# Patient Record
Sex: Female | Born: 1980 | Race: White | Hispanic: No | Marital: Married | Smoking: Never smoker
Health system: Southern US, Community
[De-identification: ages and names within clinical notes are randomized; demographics above are authoritative.]

## PROBLEM LIST (undated history)

## (undated) DIAGNOSIS — F419 Anxiety disorder, unspecified: Secondary | ICD-10-CM

## (undated) HISTORY — PX: APPENDECTOMY: SHX54

---

## 2019-05-19 ENCOUNTER — Ambulatory Visit: Payer: Self-pay | Admitting: Physician Assistant

## 2019-05-26 ENCOUNTER — Encounter: Payer: Self-pay | Admitting: Physician Assistant

## 2019-05-26 ENCOUNTER — Other Ambulatory Visit: Payer: Self-pay

## 2019-05-26 ENCOUNTER — Ambulatory Visit (INDEPENDENT_AMBULATORY_CARE_PROVIDER_SITE_OTHER): Payer: Self-pay | Admitting: Physician Assistant

## 2019-05-26 VITALS — BP 134/87 | HR 90 | Temp 99.6°F | Ht 65.0 in | Wt 135.0 lb

## 2019-05-26 DIAGNOSIS — F411 Generalized anxiety disorder: Secondary | ICD-10-CM

## 2019-05-26 DIAGNOSIS — Z91011 Allergy to milk products: Secondary | ICD-10-CM

## 2019-05-26 DIAGNOSIS — F320 Major depressive disorder, single episode, mild: Secondary | ICD-10-CM

## 2019-05-26 MED ORDER — ESCITALOPRAM OXALATE 10 MG PO TABS: 10.0000 mg | ORAL_TABLET | Freq: Every day | ORAL | 1 refills | Status: DC

## 2019-05-26 NOTE — Patient Instructions (Signed)

## 2019-06-03 DIAGNOSIS — F411 Generalized anxiety disorder: Secondary | ICD-10-CM | POA: Insufficient documentation

## 2019-06-03 DIAGNOSIS — F320 Major depressive disorder, single episode, mild: Secondary | ICD-10-CM | POA: Insufficient documentation

## 2019-06-03 DIAGNOSIS — Z91011 Allergy to milk products: Secondary | ICD-10-CM | POA: Insufficient documentation

## 2019-06-03 NOTE — Progress Notes (Signed)
BP 134/87   Pulse 90   Temp 99.6 F (37.6 C)   Ht 5\' 5"  (1.651 m)   Wt 135 lb (61.2 kg)   LMP 05/19/2019   SpO2 100%   BMI 22.47 kg/m    Subjective:    Patient ID: Brenda [name], female    DOB: 1980-04-18, 39 y.o.   MRN: [id number]  HPI 1. Depression, major, single episode, mild ([place])  2. GAD (generalized anxiety disorder)  3. Milk allergy   HPI: Brenda [name] is a 39 y.o. female presenting on 05/26/2019 for Establish Care  She comes in to be established and reports that she has anxiety and depression. She did have treatment in the past and thinks she needs to get started again. Her screens are positive  GAD 7 : Generalized Anxiety Score 05/26/2019  Nervous, Anxious, on Edge 2  Control/stop worrying 2  Worry too much - different things 2  Trouble relaxing 2  Restless 1  Easily annoyed or irritable 1  Afraid - awful might happen 2  Total GAD 7 Score 12  Anxiety Difficulty Very difficult    Depression screen PHQ 2/9 05/26/2019  Decreased Interest 1  Down, Depressed, Hopeless 1  PHQ - 2 Score 2  Altered sleeping 2  Tired, decreased energy 2  Change in appetite 1  Feeling bad or failure about yourself  1  Trouble concentrating 1  Moving slowly or fidgety/restless 2  Suicidal thoughts 0  PHQ-9 Score 11  Difficult doing work/chores Very difficult     History reviewed. No pertinent past medical history. Relevant past medical, surgical, family and social history reviewed and updated as indicated. Interim medical history since our last visit reviewed. Allergies and medications reviewed and updated. DATA REVIEWED: CHART IN EPIC  Family History reviewed for pertinent findings.  Review of Systems  Constitutional: Negative.   HENT: Negative.   Eyes: Negative.   Respiratory: Negative.   Gastrointestinal: Negative.   Genitourinary: Negative.   Psychiatric/Behavioral: Positive for decreased concentration and dysphoric mood. The patient is nervous/anxious.      Allergies as of 05/26/2019   Not on File     Medication List       Accurate as of May 26, 2019 11:59 PM. If you have any questions, ask your nurse or doctor.        escitalopram 10 MG tablet Commonly known as: Lexapro Take 1 tablet (10 mg total) by mouth daily. Started by: Terald Sleeper, PA-C          Objective:    BP 134/87   Pulse 90   Temp 99.6 F (37.6 C)   Ht 5\' 5"  (1.651 m)   Wt 135 lb (61.2 kg)   LMP 05/19/2019   SpO2 100%   BMI 22.47 kg/m   Not on File  Wt Readings from Last 3 Encounters:  05/26/19 135 lb (61.2 kg)    Physical Exam Constitutional:      General: She is not in acute distress.    Appearance: Normal appearance. She is well-developed.  HENT:     Head: Normocephalic and atraumatic.  Cardiovascular:     Rate and Rhythm: Normal rate.  Pulmonary:     Effort: Pulmonary effort is normal.  Skin:    General: Skin is warm and dry.     Findings: No rash.  Neurological:     Mental Status: She is alert and oriented to person, place, and time.     Deep Tendon Reflexes: Reflexes are  normal and symmetric.     No results found for this or any previous visit.    Assessment & Plan:   1. Depression, major, single episode, mild ([facility]CC) - escitalopram (LEXAPRO) 10 MG tablet; Take 1 tablet (10 mg total) by mouth daily.  Dispense: 30 tablet; Refill: 1  2. GAD (generalized anxiety disorder) - escitalopram (LEXAPRO) 10 MG tablet; Take 1 tablet (10 mg total) by mouth daily.  Dispense: 30 tablet; Refill: 1  3. Milk allergy monitor   Continue all other maintenance medications as listed above.  Follow up plan: Return in about 4 weeks (around 06/23/2019).  Educational handout given for anxiety  Terald Sleeper PA-C [facility] [address]  [place], [place] [address] [phone]   06/03/2019, 1:53 PM

## 2019-07-02 ENCOUNTER — Ambulatory Visit: Payer: Self-pay | Admitting: Physician Assistant

## 2019-07-02 ENCOUNTER — Ambulatory Visit: Payer: 59 | Admitting: Family Medicine

## 2019-07-02 ENCOUNTER — Other Ambulatory Visit: Payer: Self-pay

## 2019-07-02 ENCOUNTER — Encounter: Payer: Self-pay | Admitting: Family Medicine

## 2019-07-02 VITALS — BP 128/92 | HR 76 | Temp 98.3°F | Ht 65.0 in | Wt 135.2 lb

## 2019-07-02 DIAGNOSIS — Z13220 Encounter for screening for lipoid disorders: Secondary | ICD-10-CM

## 2019-07-02 DIAGNOSIS — Z130 Encounter for screening for diseases of the blood and blood-forming organs and certain disorders involving the immune mechanism: Secondary | ICD-10-CM

## 2019-07-02 DIAGNOSIS — Z7689 Persons encountering health services in other specified circumstances: Secondary | ICD-10-CM | POA: Diagnosis not present

## 2019-07-02 DIAGNOSIS — F411 Generalized anxiety disorder: Secondary | ICD-10-CM | POA: Diagnosis not present

## 2019-07-02 DIAGNOSIS — R030 Elevated blood-pressure reading, without diagnosis of hypertension: Secondary | ICD-10-CM

## 2019-07-02 DIAGNOSIS — F320 Major depressive disorder, single episode, mild: Secondary | ICD-10-CM | POA: Diagnosis not present

## 2019-07-02 DIAGNOSIS — E049 Nontoxic goiter, unspecified: Secondary | ICD-10-CM

## 2019-07-02 NOTE — Progress Notes (Signed)
Subjective: CC: f/u GAD, est care PCP: Janora Norlander, DO Brenda [name] is a 39 y.o. female presenting to clinic today for:  1.  Generalized anxiety disorder/depressive disorder Patient was seen 1 month ago by previous PCP for anxiety and depressive symptoms.  She had been on Zoloft about 18 years ago but this caused a funny feeling in her head.  She had discontinued on her own and has not been on any medication since.  She was started on Lexapro last visit and she does note some improvement in her jitteriness.  She still worries about some things but overall she is trying to force herself to be a little bit more interactive and go outside and do things.  She has been keeping a diary of her night sweats and sleeping patterns.  She notes that she has night sweats almost every evening.  Additionally, she reports alternating constipation diarrhea.  No unplanned weight loss.  No change in voice, difficulty swallowing.  No history of radiation or surgery to the neck.  She does admit to having a side of the neck that occasionally becomes swollen.  She always thought this was related to allergies and sometimes will take Benadryl for this.  After further discussion, it appears that she has a family history in her mother of thyroid disorder.  Denies any tobacco use but does smoke marijuana intermittently.  ROS: Per HPI  No Known Allergies History reviewed. No pertinent past medical history.  Current Outpatient Medications:  .  escitalopram (LEXAPRO) 10 MG tablet, Take 1 tablet (10 mg total) by mouth daily., Disp: 30 tablet, Rfl: 1 Social History   Socioeconomic History  . Marital status: Married    Spouse name: [place]  . Number of children: 2  . Years of education: some college  . 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 Smoking status: Never Smoker  . Smokeless tobacco: Never Used  Substance and Sexual Activity  .  Alcohol use: Never  . Drug use: Never  . Sexual activity: Yes    Comment: tubal  Other Topics Concern  . Not on file  Social History Narrative  . Not on file   Social Determinants of Health   Financial Resource Strain:   . Difficulty of Paying Living Expenses:   Food Insecurity:   . Worried About Charity fundraiser in the Last Year:   . Arboriculturist in the Last Year:   Transportation Needs:   . Film/video editor (Medical):   Marland Kitchen Lack of Transportation (Non-Medical):   Physical Activity:   . Days of Exercise per Week:   . Minutes of Exercise per Session:   Stress:   . Feeling of Stress :   Social Connections:   . Frequency of Communication with Friends and Family:   . Frequency of Social Gatherings with Friends and Family:   . Attends Religious Services:   . Active Member of Clubs or Organizations:   . Attends Archivist Meetings:   Marland Kitchen Marital Status:   Intimate Partner Violence:   . Fear of Current or Ex-Partner:   . Emotionally Abused:   Marland Kitchen Physically Abused:   . Sexually Abused:    Family History  Problem Relation Age of Onset  . Anxiety disorder Mother   . Asthma Mother   . COPD Mother   . Hypertension Mother   . Hyperlipidemia Mother   .  Osteoporosis Mother   . Heart disease Father   . Bipolar disorder Sister   . Schizophrenia Sister   . Drug abuse Sister   . Hyperlipidemia Maternal Grandmother   . Hypertension Maternal Grandmother   . Stroke Maternal Grandmother   . Alzheimer's disease Maternal Grandmother   . Cancer Maternal Grandfather   . Heart disease Maternal Grandfather   . Kidney disease Paternal Grandmother   . Cancer Paternal Grandfather   . Drug abuse Brother   . Anxiety disorder Brother   . Depression Brother   . Thyroid disease Brother   . Asthma Son     Objective: Office vital signs reviewed. BP (!) 128/92   Pulse 76   Temp 98.3 F (36.8 C)   Ht '5\' 5"'$  (1.651 m)   Wt 135 lb 3.2 oz (61.3 kg)   SpO2 99%   BMI 22.50  kg/m   Physical Examination:  General: Awake, alert, well nourished, No acute distress HEENT: Normal; no exophthalmos.  There is a visible soft tissue swelling noted at the base of the neck.  Palpation suggest that this is a goiter in the left lobe of the thyroid. Cardio: regular rate and rhythm, S1S2 heard, no murmurs appreciated Pulm: clear to auscultation bilaterally, no wheezes, rhonchi or rales; normal work of breathing on room air Extremities: warm, well perfused, No edema, cyanosis or clubbing; +2 pulses bilaterally MSK: normal gait and station Skin: dry; intact; no rashes or lesions; normal temperature Neuro: No tremor Psych: Mood stable, speech normal, affect appropriate, pleasant and interactive.  Depression screen [facility] 2/9 07/02/2019 05/26/2019  Decreased Interest 1 1  Down, Depressed, Hopeless 1 1  PHQ - 2 Score 2 2  Altered sleeping 2 2  Tired, decreased energy 1 2  Change in appetite 1 1  Feeling bad or failure about yourself  1 1  Trouble concentrating 2 1  Moving slowly or fidgety/restless 1 2  Suicidal thoughts 0 0  PHQ-9 Score 10 11  Difficult doing work/chores Somewhat difficult Very difficult   GAD 7 : Generalized Anxiety Score 07/02/2019 05/26/2019  Nervous, Anxious, on Edge 2 2  Control/stop worrying 2 2  Worry too much - different things 2 2  Trouble relaxing 2 2  Restless 1 1  Easily annoyed or irritable 1 1  Afraid - awful might happen 2 2  Total GAD 7 Score 12 12  Anxiety Difficulty Somewhat difficult Very difficult   Assessment/ Plan: 39 y.o. female   1. GAD (generalized anxiety disorder) Slightly improving.  I am more concerned that she may have an underlying thyroid disorder causing her anxiety and depressive symptoms.  See below - CMP14+EGFR  2. Depression, major, single episode, mild (HCC) Continue Lexapro for now until thyroid work-up complete  3. Establishing care with new doctor, encounter for  4. Screening, anemia, deficiency, iron -  CBC  5. Screening cholesterol level Fasting cholesterol check - Lipid Panel  6. Goiter Physical exam suggest goiter of the left thyroid lobe.  We will proceed with ultrasound, thyroid labs.  Will contact patient once results are available. - Thyroid Panel With TSH - Thyroid Peroxidase Antibody - Thyroglobulin antibody - Thyrotropin receptor autoabs - US THYROID; Future  7. Elevated blood pressure reading without diagnosis of hypertension Possibly due to the above.   Orders Placed This Encounter  Procedures  . TSH  . CMP14+EGFR  . Lipid Panel  . CBC   No orders of the defined types were placed in this encounter.  Janora Norlander, DO [facility] [phone]

## 2019-07-02 NOTE — Patient Instructions (Signed)
You had labs performed today.  You will be contacted with the results of the labs once they are available, usually in the next 3 business days for routine lab work.  If you have an active my chart account, they will be released to your MyChart.  If you prefer to have these labs released to you via telephone, please let us know.  If you had a pap smear or biopsy performed, expect to be contacted in about 7-10 days.   Goiter  A goiter is an enlarged thyroid gland. The thyroid is located in the lower front of the neck. It makes hormones that affect many body parts and systems, including the system that affects how quickly the body burns fuel for energy (metabolism). Most goiters are painless and are not a cause for concern. Some goiters can affect the way your thyroid makes thyroid hormones. Goiters and conditions that cause goiters can be treated, if necessary. What are the causes? Common causes of this condition include:  Lack (deficiency) of a mineral called iodine. The thyroid gland uses iodine to make thyroid hormones.  Diseases that attack healthy cells in the body (autoimmune diseases) and affect thyroid function, such as Graves' disease or Hashimoto's disease. These diseases may cause the body to produce too much thyroid hormone (hyperthyroidism) or too little of the hormone (hypothyroidism).  Conditions that cause inflammation of the thyroid (thyroiditis).  One or more small growths on the thyroid (nodular goiter). Other causes include:  Medical problems caused by abnormal genes that are passed from parent to child (genetic defects).  Thyroid injury or infection.  Tumors that may or may not be cancerous.  Pregnancy.  Certain medicines.  Exposure to radiation. In some cases, the cause may not be known. What increases the risk? This condition is more likely to develop in:  People who do not get enough iodine in their diet.  People who have a family history of  goiter.  Women.  People who are older than age 68.  People who smoke tobacco.  People who have had exposure to radiation. What are the signs or symptoms? The main symptom of this condition is swelling in the lower, front part of the neck. This swelling can range from a very small bump to a large lump. Other symptoms may include:  A tight feeling in the throat.  A hoarse voice.  Coughing.  Wheezing.  Difficulty swallowing or breathing.  Bulging veins in the neck.  Dizziness. When a goiter is the result of an overactive thyroid (hyperthyroidism), symptoms may also include:  Nervousness or restlessness.  Inability to tolerate heat.  Unexplained weight loss.  Diarrhea.  Change in the texture of hair or skin.  Changes in heartbeat, such as skipped beats, extra beats, or a rapid heart rate.  Loss of menstruation.  Shaky hands.  Increased appetite.  Sleep problems. When a goiter is the result of an underactive thyroid (hypothyroidism), symptoms may also include:  Feeling like you have no energy (lethargy).  Inability to tolerate cold.  Weight gain that is not explained by a change in diet or exercise habits.  Dry skin.  Coarse hair.  Irregular menstrual periods.  Constipation.  Sadness or depression.  Fatigue. In some cases, there may not be any symptoms and the thyroid hormone levels may be normal. How is this diagnosed? This condition may be diagnosed based on your symptoms, your medical history, and a physical exam. You may have tests, such as:  Blood tests to check  thyroid function.  Imaging tests, such as: ? Ultrasound. ? CT scan. ? MRI. ? Thyroid scan.  Removal of a tissue sample (biopsy) of the goiter or any nodules. The sample will be tested to check for cancer. How is this treated? Treatment for this condition depends on the cause and your symptoms. Treatment may include:  Medicines to regulate thyroid hormone  levels.  Anti-inflammatory medicines or steroid medicines, if the goiter is caused by inflammation.  Iodine supplements or changes to your diet, if the goiter is caused by iodine deficiency.  Radioactive iodine treatment.  Surgery to remove your thyroid. In some cases, you may only need regular check-ups with your health care provider to monitor your condition, and you may not need treatment. Follow these instructions at home:  Follow instructions from your health care provider about any changes to your diet.  Take over-the-counter and prescription medicines only as told by your health care provider. These include supplements.  Do not use any products that contain nicotine or tobacco, such as cigarettes and e-cigarettes. If you need help quitting, ask your health care provider.  Keep all follow-up visits as told by your health care provider. This is important. Contact a health care provider if:  Your symptoms do not get better with treatment.  You have nausea, vomiting, or diarrhea. Get help right away if:  You have sudden, unexplained confusion or other mental changes.  You have a fever.  You have chest pain.  You have trouble breathing or swallowing.  You suddenly become very weak.  You experience extreme restlessness.  You feel your heart racing. Summary  A goiter is an enlarged thyroid gland.  The thyroid gland is located in the lower front of the neck. It makes hormones that affect many body parts and systems, including the system that affects how quickly the body burns fuel for energy (metabolism).  The main symptom of this condition is swelling in the lower, front part of the neck. This swelling can range from a very small bump to a large lump.  Treatment for this condition depends on the cause and your symptoms. You may need medicines, supplements, or regular monitoring of your condition. This information is not intended to replace advice given to you by your  health care provider. Make sure you discuss any questions you have with your health care provider. Document Revised: 02/07/2017 Document Reviewed: 11/21/2016 Elsevier Patient Education  2020 [employer].

## 2019-07-05 LAB — CMP14+EGFR
ALT: 12 IU/L (ref 0–32)
AST: 20 IU/L (ref 0–40)
Albumin/Globulin Ratio: 1.6 (ref 1.2–2.2)
Albumin: 4.5 g/dL (ref 3.8–4.8)
Alkaline Phosphatase: 61 IU/L (ref 39–117)
BUN/Creatinine Ratio: 11 (ref 9–23)
BUN: 8 mg/dL (ref 6–20)
Bilirubin Total: 0.2 mg/dL (ref 0.0–1.2)
CO2: 23 mmol/L (ref 20–29)
Calcium: 9.7 mg/dL (ref 8.7–10.2)
Chloride: 100 mmol/L (ref 96–106)
Creatinine, Ser: 0.73 mg/dL (ref 0.57–1.00)
GFR calc Af Amer: 122 mL/min/{1.73_m2} (ref >59)
GFR calc non Af Amer: 106 mL/min/{1.73_m2} (ref >59)
Globulin, Total: 2.8 g/dL (ref 1.5–4.5)
Glucose: 85 mg/dL (ref 65–99)
Potassium: 4.5 mmol/L (ref 3.5–5.2)
Sodium: 143 mmol/L (ref 134–144)
Total Protein: 7.3 g/dL (ref 6.0–8.5)

## 2019-07-05 LAB — THYROID PANEL WITH TSH
Free Thyroxine Index: 1.7 (ref 1.2–4.9)
T3 Uptake Ratio: 25 % (ref 24–39)
T4, Total: 6.6 ug/dL (ref 4.5–12.0)
TSH: 1.96 u[IU]/mL (ref 0.450–4.500)

## 2019-07-05 LAB — THYROID PEROXIDASE ANTIBODY: Thyroperoxidase Ab SerPl-aCnc: <9 IU/mL (ref 0–34)

## 2019-07-05 LAB — CBC
Hematocrit: 42.9 % (ref 34.0–46.6)
Hemoglobin: 14.2 g/dL (ref 11.1–15.9)
MCH: 31.6 pg (ref 26.6–33.0)
MCHC: 33.1 g/dL (ref 31.5–35.7)
MCV: 96 fL (ref 79–97)
Platelets: 282 10*3/uL (ref 150–450)
RBC: 4.49 x10E6/uL (ref 3.77–5.28)
RDW: 11.8 % (ref 11.7–15.4)
WBC: 7.6 10*3/uL (ref 3.4–10.8)

## 2019-07-05 LAB — LIPID PANEL
Chol/HDL Ratio: 3.7 ratio (ref 0.0–4.4)
Cholesterol, Total: 157 mg/dL (ref 100–199)
HDL: 42 mg/dL (ref >39)
LDL Chol Calc (NIH): 101 mg/dL — ABNORMAL HIGH (ref 0–99)
Triglycerides: 73 mg/dL (ref 0–149)
VLDL Cholesterol Cal: 14 mg/dL (ref 5–40)

## 2019-07-05 LAB — THYROTROPIN RECEPTOR AUTOABS: Thyrotropin Receptor Ab: <1.1 IU/L (ref 0.00–1.75)

## 2019-07-05 LAB — THYROGLOBULIN ANTIBODY: Thyroglobulin Antibody: <1 IU/mL (ref 0.0–0.9)

## 2019-07-07 ENCOUNTER — Other Ambulatory Visit: Payer: Self-pay | Admitting: *Deleted

## 2019-07-07 DIAGNOSIS — F411 Generalized anxiety disorder: Secondary | ICD-10-CM

## 2019-07-07 DIAGNOSIS — F320 Major depressive disorder, single episode, mild: Secondary | ICD-10-CM

## 2019-07-07 MED ORDER — ESCITALOPRAM OXALATE 10 MG PO TABS: 10.0000 mg | ORAL_TABLET | Freq: Every day | ORAL | 1 refills | Status: DC

## 2019-07-09 ENCOUNTER — Ambulatory Visit (HOSPITAL_COMMUNITY)
Admission: RE | Admit: 2019-07-09 | Discharge: 2019-07-09 | Disposition: A | Discharge status: Home / Self Care | Payer: 59 | Source: Ambulatory Visit | Attending: Family Medicine | Admitting: Family Medicine

## 2019-07-09 ENCOUNTER — Other Ambulatory Visit: Payer: Self-pay

## 2019-07-09 DIAGNOSIS — E049 Nontoxic goiter, unspecified: Secondary | ICD-10-CM | POA: Insufficient documentation

## 2019-07-13 ENCOUNTER — Other Ambulatory Visit: Payer: Self-pay | Admitting: Family Medicine

## 2019-07-13 DIAGNOSIS — E041 Nontoxic single thyroid nodule: Secondary | ICD-10-CM

## 2019-07-28 ENCOUNTER — Ambulatory Visit (HOSPITAL_COMMUNITY)
Admission: RE | Admit: 2019-07-28 | Discharge: 2019-07-28 | Disposition: A | Discharge status: Home / Self Care | Payer: 59 | Source: Ambulatory Visit | Attending: Family Medicine | Admitting: Family Medicine

## 2019-07-28 ENCOUNTER — Other Ambulatory Visit: Payer: Self-pay

## 2019-07-28 ENCOUNTER — Encounter (HOSPITAL_COMMUNITY): Payer: Self-pay

## 2019-07-28 DIAGNOSIS — E041 Nontoxic single thyroid nodule: Secondary | ICD-10-CM | POA: Diagnosis not present

## 2019-07-28 MED ORDER — LIDOCAINE HCL (PF) 2 % IJ SOLN
INTRAMUSCULAR | Status: AC
  Filled 2019-07-28: qty 10

## 2019-07-28 NOTE — Procedures (Signed)
PreOperative Dx: LEFT thyroid nodule Postoperative Dx: LEFT thyroid nodule Procedure:   US guided FNA of LEFT thyroid nodule Radiologist:  Thornton Papas Anesthesia:  2 ml of 2% lidocaine Specimen:  FNA x 5 (3 cytology, 2 Afirma) EBL:   < 1 ml Complications: None

## 2019-07-29 LAB — CYTOLOGY - NON PAP

## 2019-07-30 ENCOUNTER — Telehealth: Payer: Self-pay | Admitting: Family Medicine

## 2019-07-30 NOTE — Telephone Encounter (Signed)
Spoke to patient.  We reviewed the results which showed atypical cells of undetermined significance.  This has been sent for Afirma testing and plan pending her risk.  I have reached out to Dr. Benjamine Mola about her case as well.  For now, we will wait for the test results and then plan referral pending these results.

## 2019-07-30 NOTE — Telephone Encounter (Signed)
Provider to review results.

## 2019-08-12 ENCOUNTER — Encounter: Payer: Self-pay | Admitting: Family Medicine

## 2019-08-12 ENCOUNTER — Encounter (HOSPITAL_COMMUNITY): Payer: Self-pay | Admitting: Diagnostic Radiology

## 2019-08-13 ENCOUNTER — Other Ambulatory Visit: Payer: Self-pay | Admitting: Family Medicine

## 2019-08-13 DIAGNOSIS — R897 Abnormal histological findings in specimens from other organs, systems and tissues: Secondary | ICD-10-CM

## 2019-08-13 DIAGNOSIS — E041 Nontoxic single thyroid nodule: Secondary | ICD-10-CM

## 2019-08-13 NOTE — Telephone Encounter (Signed)
Spoke to pt on phone. Biopsy was indeterminate.  I have placed referral to Dr Benjamine Mola, as I have been curbside consulting with him for her case.

## 2019-08-30 ENCOUNTER — Encounter: Payer: Self-pay | Admitting: Family Medicine

## 2019-08-31 ENCOUNTER — Other Ambulatory Visit: Payer: Self-pay | Admitting: Otolaryngology

## 2019-09-03 ENCOUNTER — Other Ambulatory Visit: Payer: Self-pay

## 2019-09-03 ENCOUNTER — Encounter (HOSPITAL_BASED_OUTPATIENT_CLINIC_OR_DEPARTMENT_OTHER): Payer: Self-pay | Admitting: Otolaryngology

## 2019-09-10 ENCOUNTER — Other Ambulatory Visit (HOSPITAL_COMMUNITY)
Admission: RE | Admit: 2019-09-10 | Discharge: 2019-09-10 | Disposition: A | Discharge status: Home / Self Care | Payer: 59 | Source: Ambulatory Visit | Attending: Otolaryngology | Admitting: Otolaryngology

## 2019-09-10 DIAGNOSIS — Z20822 Contact with and (suspected) exposure to covid-19: Secondary | ICD-10-CM | POA: Diagnosis not present

## 2019-09-10 DIAGNOSIS — Z01812 Encounter for preprocedural laboratory examination: Secondary | ICD-10-CM | POA: Insufficient documentation

## 2019-09-10 LAB — SARS CORONAVIRUS 2 (TAT 6-24 HRS): SARS Coronavirus 2: NEGATIVE

## 2019-09-14 ENCOUNTER — Ambulatory Visit (HOSPITAL_BASED_OUTPATIENT_CLINIC_OR_DEPARTMENT_OTHER)
Admission: RE | Admit: 2019-09-14 | Discharge: 2019-09-15 | Disposition: A | Discharge status: Home / Self Care | Payer: 59 | Attending: Otolaryngology | Admitting: Otolaryngology

## 2019-09-14 ENCOUNTER — Encounter (HOSPITAL_BASED_OUTPATIENT_CLINIC_OR_DEPARTMENT_OTHER)
Admission: RE | Disposition: A | Discharge status: Home / Self Care | Payer: Self-pay | Source: Home / Self Care | Attending: Otolaryngology

## 2019-09-14 ENCOUNTER — Encounter (HOSPITAL_BASED_OUTPATIENT_CLINIC_OR_DEPARTMENT_OTHER): Payer: Self-pay | Admitting: Otolaryngology

## 2019-09-14 ENCOUNTER — Other Ambulatory Visit: Payer: Self-pay

## 2019-09-14 ENCOUNTER — Ambulatory Visit (HOSPITAL_BASED_OUTPATIENT_CLINIC_OR_DEPARTMENT_OTHER): Payer: 59 | Admitting: Certified Registered"

## 2019-09-14 DIAGNOSIS — E890 Postprocedural hypothyroidism: Secondary | ICD-10-CM

## 2019-09-14 DIAGNOSIS — R1310 Dysphagia, unspecified: Secondary | ICD-10-CM | POA: Diagnosis not present

## 2019-09-14 DIAGNOSIS — C73 Malignant neoplasm of thyroid gland: Secondary | ICD-10-CM | POA: Diagnosis not present

## 2019-09-14 DIAGNOSIS — F419 Anxiety disorder, unspecified: Secondary | ICD-10-CM | POA: Insufficient documentation

## 2019-09-14 DIAGNOSIS — E079 Disorder of thyroid, unspecified: Secondary | ICD-10-CM | POA: Diagnosis present

## 2019-09-14 DIAGNOSIS — F329 Major depressive disorder, single episode, unspecified: Secondary | ICD-10-CM | POA: Diagnosis not present

## 2019-09-14 HISTORY — DX: Anxiety disorder, unspecified: F41.9

## 2019-09-14 HISTORY — PX: THYROIDECTOMY: SHX17

## 2019-09-14 LAB — POCT PREGNANCY, URINE: Preg Test, Ur: NEGATIVE

## 2019-09-14 SURGERY — THYROIDECTOMY
Anesthesia: General | Site: Neck | Laterality: Left

## 2019-09-14 MED ORDER — PROPOFOL 10 MG/ML IV BOLUS
INTRAVENOUS | Status: AC
  Filled 2019-09-14: qty 20

## 2019-09-14 MED ORDER — MORPHINE SULFATE (PF) 4 MG/ML IV SOLN: 2.0000 mg | INTRAVENOUS | Status: DC | PRN

## 2019-09-14 MED ORDER — MEPERIDINE HCL 25 MG/ML IJ SOLN: 6.2500 mg | INTRAMUSCULAR | Status: DC | PRN

## 2019-09-14 MED ORDER — OXYCODONE HCL 5 MG/5ML PO SOLN: 5.0000 mg | Freq: Once | ORAL | Status: DC | PRN

## 2019-09-14 MED ORDER — ESCITALOPRAM OXALATE 20 MG PO TABS
10.0000 mg | ORAL_TABLET | Freq: Every day | ORAL | Status: DC
  Filled 2019-09-14: qty 0.5

## 2019-09-14 MED ORDER — OXYMETAZOLINE HCL 0.05 % NA SOLN
NASAL | Status: AC
  Filled 2019-09-14: qty 30

## 2019-09-14 MED ORDER — FENTANYL CITRATE (PF) 100 MCG/2ML IJ SOLN
INTRAMUSCULAR | Status: DC | PRN
  Administered 2019-09-14 (×2): 50 ug via INTRAVENOUS
  Administered 2019-09-14: 25 ug via INTRAVENOUS

## 2019-09-14 MED ORDER — KCL IN DEXTROSE-NACL 20-5-0.45 MEQ/L-%-% IV SOLN: INTRAVENOUS | Status: DC

## 2019-09-14 MED ORDER — OXYCODONE-ACETAMINOPHEN 5-325 MG PO TABS
1.0000 | ORAL_TABLET | ORAL | Status: DC | PRN
  Administered 2019-09-14 – 2019-09-15 (×3): 1 via ORAL
  Filled 2019-09-14 (×3): qty 1

## 2019-09-14 MED ORDER — FENTANYL CITRATE (PF) 100 MCG/2ML IJ SOLN
INTRAMUSCULAR | Status: AC
  Filled 2019-09-14: qty 2

## 2019-09-14 MED ORDER — MIDAZOLAM HCL 2 MG/2ML IJ SOLN
INTRAMUSCULAR | Status: AC
  Filled 2019-09-14: qty 2

## 2019-09-14 MED ORDER — ONDANSETRON HCL 4 MG/2ML IJ SOLN
INTRAMUSCULAR | Status: DC | PRN
  Administered 2019-09-14: 4 mg via INTRAVENOUS

## 2019-09-14 MED ORDER — DEXAMETHASONE SODIUM PHOSPHATE 10 MG/ML IJ SOLN
INTRAMUSCULAR | Status: DC | PRN
  Administered 2019-09-14: 10 mg via INTRAVENOUS

## 2019-09-14 MED ORDER — LACTATED RINGERS IV SOLN: INTRAVENOUS | Status: DC

## 2019-09-14 MED ORDER — LIDOCAINE-EPINEPHRINE 1 %-1:100000 IJ SOLN
INTRAMUSCULAR | Status: DC | PRN
  Administered 2019-09-14: 2 mL via INTRADERMAL

## 2019-09-14 MED ORDER — LIDOCAINE 2% (20 MG/ML) 5 ML SYRINGE
INTRAMUSCULAR | Status: DC | PRN
  Administered 2019-09-14: 60 mg via INTRAVENOUS

## 2019-09-14 MED ORDER — BACITRACIN ZINC 500 UNIT/GM EX OINT
TOPICAL_OINTMENT | CUTANEOUS | Status: AC
  Filled 2019-09-14: qty 0.9

## 2019-09-14 MED ORDER — ACETAMINOPHEN 10 MG/ML IV SOLN: 1000.0000 mg | Freq: Once | INTRAVENOUS | Status: DC | PRN

## 2019-09-14 MED ORDER — SUCCINYLCHOLINE CHLORIDE 20 MG/ML IJ SOLN
INTRAMUSCULAR | Status: DC | PRN
  Administered 2019-09-14: 120 mg via INTRAVENOUS

## 2019-09-14 MED ORDER — ACETAMINOPHEN 325 MG PO TABS
ORAL_TABLET | ORAL | Status: AC
  Filled 2019-09-14: qty 2

## 2019-09-14 MED ORDER — PROMETHAZINE HCL 25 MG/ML IJ SOLN
INTRAMUSCULAR | Status: AC
  Filled 2019-09-14: qty 1

## 2019-09-14 MED ORDER — PROPOFOL 10 MG/ML IV BOLUS
INTRAVENOUS | Status: DC | PRN
  Administered 2019-09-14: 140 mg via INTRAVENOUS

## 2019-09-14 MED ORDER — OXYCODONE HCL 5 MG PO TABS: 5.0000 mg | ORAL_TABLET | Freq: Once | ORAL | Status: DC | PRN

## 2019-09-14 MED ORDER — MIDAZOLAM HCL 5 MG/5ML IJ SOLN
INTRAMUSCULAR | Status: DC | PRN
  Administered 2019-09-14: 2 mg via INTRAVENOUS

## 2019-09-14 MED ORDER — ACETAMINOPHEN 160 MG/5ML PO SOLN: 325.0000 mg | Freq: Once | ORAL | Status: AC | PRN

## 2019-09-14 MED ORDER — BACITRACIN ZINC 500 UNIT/GM EX OINT
TOPICAL_OINTMENT | CUTANEOUS | Status: AC
  Filled 2019-09-14: qty 28.35

## 2019-09-14 MED ORDER — OXYCODONE-ACETAMINOPHEN 10-325 MG PO TABS: 1.0000 | ORAL_TABLET | ORAL | 0 refills | Status: AC | PRN

## 2019-09-14 MED ORDER — PROMETHAZINE HCL 25 MG/ML IJ SOLN
6.2500 mg | INTRAMUSCULAR | Status: DC | PRN
  Administered 2019-09-14: 6.25 mg via INTRAVENOUS

## 2019-09-14 MED ORDER — ACETAMINOPHEN 325 MG PO TABS
325.0000 mg | ORAL_TABLET | Freq: Once | ORAL | Status: AC | PRN
  Administered 2019-09-14: 650 mg via ORAL

## 2019-09-14 MED ORDER — FENTANYL CITRATE (PF) 100 MCG/2ML IJ SOLN
25.0000 ug | INTRAMUSCULAR | Status: DC | PRN
  Administered 2019-09-14: 50 ug via INTRAVENOUS

## 2019-09-14 MED ORDER — CEFAZOLIN SODIUM-DEXTROSE 2-3 GM-%(50ML) IV SOLR
INTRAVENOUS | Status: DC | PRN
  Administered 2019-09-14: 2 g via INTRAVENOUS

## 2019-09-14 MED ORDER — AMOXICILLIN 875 MG PO TABS: 875.0000 mg | ORAL_TABLET | Freq: Two times a day (BID) | ORAL | 0 refills | Status: AC

## 2019-09-14 SURGICAL SUPPLY — 56 items
ATTRACTOMAT 16X20 MAGNETIC DRP (DRAPES) IMPLANT
BLADE CLIPPER SURG (BLADE) IMPLANT
BLADE SURG 10 STRL SS (BLADE) IMPLANT
BLADE SURG 15 STRL LF DISP TIS (BLADE) ×1 IMPLANT
BLADE SURG 15 STRL SS (BLADE) ×1
CANISTER SUCT 1200ML W/VALVE (MISCELLANEOUS) ×2 IMPLANT
CLIP VESOCCLUDE SM WIDE 6/CT (CLIP) IMPLANT
CORD BIPOLAR FORCEPS 12FT (ELECTRODE) ×2 IMPLANT
COVER BACK TABLE 60X90IN (DRAPES) ×2 IMPLANT
COVER MAYO STAND STRL (DRAPES) ×2 IMPLANT
COVER WAND RF STERILE (DRAPES) IMPLANT
DECANTER SPIKE VIAL GLASS SM (MISCELLANEOUS) IMPLANT
DERMABOND ADVANCED (GAUZE/BANDAGES/DRESSINGS) ×1
DERMABOND ADVANCED .7 DNX12 (GAUZE/BANDAGES/DRESSINGS) ×1 IMPLANT
DRAIN CHANNEL 10F 3/8 F FF (DRAIN) ×2 IMPLANT
DRAPE U-SHAPE 76X120 STRL (DRAPES) ×2 IMPLANT
ELECT COATED BLADE 2.86 ST (ELECTRODE) ×2 IMPLANT
ELECT REM PT RETURN 9FT ADLT (ELECTROSURGICAL) ×2
ELECTRODE REM PT RTRN 9FT ADLT (ELECTROSURGICAL) ×1 IMPLANT
EVACUATOR SILICONE 100CC (DRAIN) ×2 IMPLANT
FORCEPS BIPOLAR SPETZLER 8 1.0 (NEUROSURGERY SUPPLIES) ×2 IMPLANT
GAUZE 4X4 16PLY RFD (DISPOSABLE) ×2 IMPLANT
GAUZE SPONGE 4X4 12PLY STRL LF (GAUZE/BANDAGES/DRESSINGS) IMPLANT
GLOVE BIO SURGEON STRL SZ 6.5 (GLOVE) ×8 IMPLANT
GLOVE BIO SURGEON STRL SZ7.5 (GLOVE) ×2 IMPLANT
GOWN STRL REUS W/ TWL LRG LVL3 (GOWN DISPOSABLE) ×4 IMPLANT
GOWN STRL REUS W/TWL LRG LVL3 (GOWN DISPOSABLE) ×4
HEMOSTAT SURGICEL 2X14 (HEMOSTASIS) IMPLANT
NEEDLE HYPO 25X1 1.5 SAFETY (NEEDLE) ×2 IMPLANT
NS IRRIG 1000ML POUR BTL (IV SOLUTION) ×2 IMPLANT
PACK BASIN DAY SURGERY FS (CUSTOM PROCEDURE TRAY) ×2 IMPLANT
PENCIL SMOKE EVACUATOR (MISCELLANEOUS) ×2 IMPLANT
PIN SAFETY STERILE (MISCELLANEOUS) ×2 IMPLANT
PROBE NERVBE PRASS .33 (MISCELLANEOUS) ×2 IMPLANT
SHEARS HARMONIC 9CM CVD (BLADE) ×2 IMPLANT
SLEEVE SCD COMPRESS KNEE MED (MISCELLANEOUS) ×2 IMPLANT
SPONGE INTESTINAL PEANUT (DISPOSABLE) ×4 IMPLANT
STAPLER VISISTAT 35W (STAPLE) IMPLANT
SUT ETHILON 3 0 PS 1 (SUTURE) ×2 IMPLANT
SUT PROLENE 5 0 P 3 (SUTURE) IMPLANT
SUT SILK 2 0 SH (SUTURE) ×2 IMPLANT
SUT SILK 2 0 TIES 17X18 (SUTURE)
SUT SILK 2-0 18XBRD TIE BLK (SUTURE) IMPLANT
SUT SILK 3 0 TIES 17X18 (SUTURE) ×2
SUT SILK 3-0 18XBRD TIE BLK (SUTURE) ×2 IMPLANT
SUT VIC AB 3-0 FS2 27 (SUTURE) ×2 IMPLANT
SUT VICRYL 4-0 PS2 18IN ABS (SUTURE) ×4 IMPLANT
SYR BULB EAR ULCER 3OZ GRN STR (SYRINGE) ×2 IMPLANT
SYR CONTROL 10ML LL (SYRINGE) ×2 IMPLANT
TOWEL GREEN STERILE FF (TOWEL DISPOSABLE) ×4 IMPLANT
TRAY DSU PREP LF (CUSTOM PROCEDURE TRAY) ×2 IMPLANT
TUBE CONNECTING 20X1/4 (TUBING) ×2 IMPLANT
TUBE ENDOTRAC NIMS EMG 6MM (MISCELLANEOUS) IMPLANT
TUBE ENDOTRAC NIMS EMG 7MM (MISCELLANEOUS) ×2 IMPLANT
TUBE ENDOTRAC NIMS EMG 8MM (MISCELLANEOUS) IMPLANT
TUBE ENDOTRAC NIMS EMG 9MM (MISCELLANEOUS) IMPLANT

## 2019-09-14 NOTE — Op Note (Signed)
DATE OF PROCEDURE:  09/14/2019                              OPERATIVE REPORT  SURGEON:  Leta Baptist, MD  PREOPERATIVE DIAGNOSES: 1. Suspicious left thyroid mass.  POSTOPERATIVE DIAGNOSES: 1. Suspicious left thyroid mass.  PROCEDURE PERFORMED: Left total thyroid lobectomy.  ANESTHESIA:  General endotracheal tube anesthesia.  COMPLICATIONS:  None.  ESTIMATED BLOOD LOSS: 20 ml  INDICATION FOR PROCEDURE:  Lilianne Piazza is a 39 y.o. female with a history of a large left thyroid nodule. The patient underwent a thyroid ultrasound which showed a 4.1 cm left thyroid nodule. The patient underwent FNA with atypia noted. AFIRMA testing was performed with 50% chance of malignancy noted. The patient was having some mild compressive symptoms. She noted occasional dysphagia and throat pressure.Based on the above findings, the decision was made for the patient to undergo the above stated procedure. Likelihood of success in reducing symptoms was also discussed.  The risks, benefits, alternatives, and details of the procedure were discussed with the patient.  Questions were invited and answered.  Informed consent was obtained.  DESCRIPTION:  The patient was taken to the operating room and placed supine on the operating table.  General endotracheal tube anesthesia was administered by the anesthesiologist.  A nerve monitoring endotracheal tube was used.  The nerve monitoring system was functional throughout the case.  The patient was positioned and prepped and draped in the standard fashion for thyroid surgery.  1% lidocaine with 1 100,000 epinephrine was infiltrated at the planned site of incision.  A transverse lower neck incision was made.  The incision was carried down to the level of the platysma muscles.  Superiorly based and inferiorly based subplatysmal flaps were elevated in the standard fashion.  The strap muscles were divided at midline, and retracted laterally, exposing the thyroid gland.  Careful  dissection was performed to free the left thyroid lobe from the surrounding soft tissue.  The patient was noted to have a large 4 cm left thyroid nodule.  The left recurrent laryngeal nerve was identified and preserved.  The nerve was noted to be functional throughout the case.  An inferior left parathyroid gland was also identified and preserved.  The entire left thyroid lobe was resected and sent to the pathology department for permanent histologic identification.  A #10 JP drain was placed.  The strap muscles were reapproximated with 4-0 Vicryl sutures.  The incision was closed in layers with 4-0 Vicryl and Dermabond.  The care of the patient was turned over to the anesthesiologist.  The patient was awakened from anesthesia without difficulty.  The patient was extubated and transferred to the recovery room in good condition.  OPERATIVE FINDINGS:  A large 4 cm left thyroid mass.  SPECIMEN: Left thyroid lobe.  FOLLOWUP CARE:  The patient will be admitted for overnight observation.  Lawrence Mitch W Oluwatobi Ruppe 09/14/2019 10:41 AM

## 2019-09-14 NOTE — H&P (View-Only) (Signed)
Cc: Left thyroid mass  HPI: Brenda patient is a 39 y/o female who presents today for evaluation of a large left thyroid nodule. Brenda patient is seen in consultation requested by [place]. Brenda patient first noticed a neck mass 2 months ago. She underwent a thyroid ultrasound which showed a 4.1 cm left thyroid nodule. Brenda patient underwent FNA with atypia noted. AFIRMA testing was performed with 50% chance of malignancy noted. Brenda patient is having some mild compressive symptoms. She notes occasional dysphagia and throat pressure. No previous ENT surgery is noted.   Brenda patient's review of systems (constitutional, eyes, ENT, cardiovascular, respiratory, GI, musculoskeletal, skin, neurologic, psychiatric, endocrine, hematologic, allergic) is noted in Brenda ROS questionnaire.  It is reviewed with Brenda patient.   Family health history: Diabetes, heart disease, lung cancer, dementia.  Major events: Appendectomy.  Ongoing medical problems: Night sweats, headaches, anxiety disorder, depression.  Social history: Brenda patient is married. She denies Brenda use of tobacco, alcohol or illegal drugs.   Exam: General: Communicates without difficulty, well nourished, no acute distress. Head: Normocephalic, no evidence injury, no tenderness, facial buttresses intact without stepoff. Eyes: PERRL, EOMI. No scleral icterus, conjunctivae clear. Neuro: CN II exam reveals vision grossly intact.  No nystagmus at any point of gaze. Ears: Auricles well formed without lesions.  Ear canals are intact without mass or lesion.  No erythema or edema is appreciated.  Brenda TMs are intact without fluid. Nose: External evaluation reveals normal support and skin without lesions.  Dorsum is intact.  Anterior rhinoscopy reveals healthy pink mucosa over anterior aspect of inferior turbinates and intact septum.  No purulence noted. Oral:  Oral cavity and oropharynx are intact, symmetric, without erythema or edema.  Mucosa is  moist without lesions. Neck: Full range of motion without pain.  There is no significant lymphadenopathy.  No masses palpable. Large left thyroid nodule.  Parotid glands and submandibular glands equal bilaterally without mass.  Trachea is midline. Neuro:  CN 2-12 grossly intact. Gait normal. Vestibular: No nystagmus at any point of gaze. Brenda cerebellar examination is unremarkable.   Procedure:  Flexible Fiberoptic Laryngoscopy -- Risks, benefits, and alternatives of flexible endoscopy were explained to Brenda patient.  Specific mention was made of Brenda risk of throat numbness with difficulty swallowing, possible bleeding from Brenda nose and mouth, and pain from Brenda procedure.  Brenda patient gave oral consent to proceed.  Brenda nasal cavities were decongested and anesthetised with a combination of oxymetazoline and 4% lidocaine solution.  Brenda flexible scope was inserted into Brenda right nasal cavity and advanced towards Brenda nasopharynx.  Visualized mucosa over Brenda turbinates and septum were as described above.  Brenda nasopharynx was clear.  Oropharyngeal walls were symmetric and mobile without lesion, mass, or edema.  Hypopharynx was also without  lesion or edema.  Larynx was mobile without lesions. Supraglottic structures were free of edema, mass, and asymmetry.  True vocal folds were mobile and  white without mass or lesion.  Base of tongue was within normal limits.  Brenda patient tolerated Brenda procedure well.   Assessment 1. A 4.1 cm left thyroid nodule is noted with atypia on FNA. Afirma testing showed 50% chance of malignancy.  2. Vocal cords are noted to be mobile on laryngoscopy exam with no other suspicious mass or lesion noted.   Plan  1. Brenda physical exam, pathology, and laryngoscopy findings are extensively reviewed with Brenda patient.  2. Brenda treatment options are discussed. Options include left hemithyroidectomy, total  thyroidectomy, and observation. Brenda risks, benefits, alternatives, and details of Brenda options  are reviewed with Brenda patient. Questions are invited and answered. 3. Brenda patient is interested in proceeding with Brenda left hemithyroidectomy procedure.  We will schedule Brenda procedure in accordance with Brenda family schedule.

## 2019-09-14 NOTE — H&P (Signed)
Cc: Left thyroid mass  HPI: The patient is a 39 y/o female who presents today for evaluation of a large left thyroid nodule. The patient is seen in consultation requested by [place]. The patient first noticed a neck mass 2 months ago. She underwent a thyroid ultrasound which showed a 4.1 cm left thyroid nodule. The patient underwent FNA with atypia noted. AFIRMA testing was performed with 50% chance of malignancy noted. The patient is having some mild compressive symptoms. She notes occasional dysphagia and throat pressure. No previous ENT surgery is noted.   The patient's review of systems (constitutional, eyes, ENT, cardiovascular, respiratory, GI, musculoskeletal, skin, neurologic, psychiatric, endocrine, hematologic, allergic) is noted in the ROS questionnaire.  It is reviewed with the patient.   Family health history: Diabetes, heart disease, lung cancer, dementia.  Major events: Appendectomy.  Ongoing medical problems: Night sweats, headaches, anxiety disorder, depression.  Social history: The patient is married. She denies the use of tobacco, alcohol or illegal drugs.   Exam: General: Communicates without difficulty, well nourished, no acute distress. Head: Normocephalic, no evidence injury, no tenderness, facial buttresses intact without stepoff. Eyes: PERRL, EOMI. No scleral icterus, conjunctivae clear. Neuro: CN II exam reveals vision grossly intact.  No nystagmus at any point of gaze. Ears: Auricles well formed without lesions.  Ear canals are intact without mass or lesion.  No erythema or edema is appreciated.  The TMs are intact without fluid. Nose: External evaluation reveals normal support and skin without lesions.  Dorsum is intact.  Anterior rhinoscopy reveals healthy pink mucosa over anterior aspect of inferior turbinates and intact septum.  No purulence noted. Oral:  Oral cavity and oropharynx are intact, symmetric, without erythema or edema.  Mucosa is  moist without lesions. Neck: Full range of motion without pain.  There is no significant lymphadenopathy.  No masses palpable. Large left thyroid nodule.  Parotid glands and submandibular glands equal bilaterally without mass.  Trachea is midline. Neuro:  CN 2-12 grossly intact. Gait normal. Vestibular: No nystagmus at any point of gaze. The cerebellar examination is unremarkable.   Procedure:  Flexible Fiberoptic Laryngoscopy -- Risks, benefits, and alternatives of flexible endoscopy were explained to the patient.  Specific mention was made of the risk of throat numbness with difficulty swallowing, possible bleeding from the nose and mouth, and pain from the procedure.  The patient gave oral consent to proceed.  The nasal cavities were decongested and anesthetised with a combination of oxymetazoline and 4% lidocaine solution.  The flexible scope was inserted into the right nasal cavity and advanced towards the nasopharynx.  Visualized mucosa over the turbinates and septum were as described above.  The nasopharynx was clear.  Oropharyngeal walls were symmetric and mobile without lesion, mass, or edema.  Hypopharynx was also without  lesion or edema.  Larynx was mobile without lesions. Supraglottic structures were free of edema, mass, and asymmetry.  True vocal folds were mobile and  white without mass or lesion.  Base of tongue was within normal limits.  The patient tolerated the procedure well.   Assessment 1. A 4.1 cm left thyroid nodule is noted with atypia on FNA. Afirma testing showed 50% chance of malignancy.  2. Vocal cords are noted to be mobile on laryngoscopy exam with no other suspicious mass or lesion noted.   Plan  1. The physical exam, pathology, and laryngoscopy findings are extensively reviewed with the patient.  2. The treatment options are discussed. Options include left hemithyroidectomy, total  thyroidectomy, and observation. The risks, benefits, alternatives, and details of the options  are reviewed with the patient. Questions are invited and answered. 3. The patient is interested in proceeding with the left hemithyroidectomy procedure.  We will schedule the procedure in accordance with the family schedule.

## 2019-09-14 NOTE — Anesthesia Postprocedure Evaluation (Signed)
Anesthesia Post Note  Patient: Brenda [name]  Procedure(s) Performed: LEFT HEMITHYROIDECTOMY (Left Neck)     Patient location during evaluation: PACU Anesthesia Type: General Level of consciousness: awake and alert Pain management: pain level controlled Vital Signs Assessment: post-procedure vital signs reviewed and stable Respiratory status: spontaneous breathing, nonlabored ventilation, respiratory function stable and patient connected to nasal cannula oxygen Cardiovascular status: blood pressure returned to baseline and stable Postop Assessment: no apparent nausea or vomiting Anesthetic complications: no   No complications documented.  Last Vitals:  Vitals:   09/14/19 1145 09/14/19 1200  BP:  (!) 145/76  Pulse:  (!) 55  Resp: 18 16  Temp:  36.6 C  SpO2:  98%    Last Pain:  Vitals:   09/14/19 1430  TempSrc:   PainSc: 0-No pain                 Effie Berkshire

## 2019-09-14 NOTE — Anesthesia Procedure Notes (Signed)
Procedure Name: Intubation Date/Time: 09/14/2019 8:53 AM Performed by: Signe Colt, CRNA Pre-anesthesia Checklist: Patient identified, Emergency Drugs available, Suction available and Patient being monitored Patient Re-evaluated:Patient Re-evaluated prior to induction Oxygen Delivery Method: Circle system utilized Preoxygenation: Pre-oxygenation with 100% oxygen Induction Type: IV induction Ventilation: Mask ventilation without difficulty Laryngoscope Size: Mac and 3 Grade View: Grade I Tube type: Oral (NIM tube) Tube size: 7.0 mm Number of attempts: 1 Airway Equipment and Method: Stylet and Bite block Placement Confirmation: ETT inserted through vocal cords under direct vision,  positive ETCO2 and breath sounds checked- equal and bilateral Tube secured with: Tape Dental Injury: Teeth and Oropharynx as per pre-operative assessment

## 2019-09-14 NOTE — Transfer of Care (Signed)
Immediate Anesthesia Transfer of Care Note  Patient: Brenda [name]  Procedure(s) Performed: LEFT HEMITHYROIDECTOMY (Left Neck)  Patient Location: PACU  Anesthesia Type:General  Level of Consciousness: drowsy  Airway & Oxygen Therapy: Patient Spontanous Breathing and Patient connected to face mask oxygen  Post-op Assessment: Report given to RN and Post -op Vital signs reviewed and stable  Post vital signs: Reviewed and stable  Last Vitals:  Vitals Value Taken Time  BP    Temp    Pulse    Resp    SpO2      Last Pain:  Vitals:   09/14/19 0707  TempSrc: Oral  PainSc: 0-No pain         Complications: No complications documented.

## 2019-09-14 NOTE — Anesthesia Preprocedure Evaluation (Signed)
Anesthesia Evaluation  Patient identified by MRN, date of birth, ID band Patient awake    Reviewed: Allergy & Precautions, NPO status , Patient's Chart, lab work & pertinent test results  Airway Mallampati: I  TM Distance: >3 FB Neck ROM: Full    Dental  (+) Teeth Intact, Dental Advisory Given   Pulmonary    breath sounds clear to auscultation       Cardiovascular negative cardio ROS   Rhythm:Regular Rate:Normal     Neuro/Psych PSYCHIATRIC DISORDERS Anxiety Depression    GI/Hepatic negative GI ROS, Neg liver ROS,   Endo/Other  negative endocrine ROS  Renal/GU negative Renal ROS     Musculoskeletal negative musculoskeletal ROS (+)   Abdominal Normal abdominal exam  (+)   Peds  Hematology negative hematology ROS (+)   Anesthesia Other Findings   Reproductive/Obstetrics                             Anesthesia Physical Anesthesia Plan  ASA: II  Anesthesia Plan: General   Post-op Pain Management:    Induction: Intravenous  PONV Risk Score and Plan: 4 or greater and Ondansetron, Dexamethasone, Midazolam and Scopolamine patch - Pre-op  Airway Management Planned: Oral ETT  Additional Equipment: None  Intra-op Plan:   Post-operative Plan: Extubation in OR  Informed Consent: I have reviewed the patients History and Physical, chart, labs and discussed the procedure including the risks, benefits and alternatives for the proposed anesthesia with the patient or authorized representative who has indicated his/her understanding and acceptance.     Dental advisory given  Plan Discussed with: CRNA  Anesthesia Plan Comments:         Anesthesia Quick Evaluation

## 2019-09-15 DIAGNOSIS — C73 Malignant neoplasm of thyroid gland: Secondary | ICD-10-CM | POA: Diagnosis not present

## 2019-09-15 NOTE — Discharge Instructions (Signed)
Post Anesthesia Home Care Instructions  Activity: Get plenty of rest for the remainder of the day. A responsible individual must stay with you for 24 hours following the procedure.  For the next 24 hours, DO NOT: -Drive a car -Paediatric nurse -Drink alcoholic beverages -Take any medication unless instructed by your physician -Make any legal decisions or sign important papers.  Meals: Start with liquid foods such as gelatin or soup. Progress to regular foods as tolerated. Avoid greasy, spicy, heavy foods. If nausea and/or vomiting occur, drink only clear liquids until the nausea and/or vomiting subsides. Call your physician if vomiting continues.  Special Instructions/Symptoms: Your throat may feel dry or sore from the anesthesia or the breathing tube placed in your throat during surgery. If this causes discomfort, gargle with warm salt water. The discomfort should disappear within 24 hours.  If you had a scopolamine patch placed behind your ear for the management of post- operative nausea and/or vomiting:  1. The medication in the patch is effective for 72 hours, after which it should be removed.  Wrap patch in a tissue and discard in the trash. Wash hands thoroughly with soap and water. 2. You may remove the patch earlier than 72 hours if you experience unpleasant side effects which may include dry mouth, dizziness or visual disturbances. 3. Avoid touching the patch. Wash your hands with soap and water after contact with the patch.    Regional Anesthesia Blocks  1. Numbness or the inability to move the "blocked" extremity may last from 3-48 hours after placement. The length of time depends on the medication injected and your individual response to the medication. If the numbness is not going away after 48 hours, call your surgeon.  2. The extremity that is blocked will need to be protected until the numbness is gone and the  Strength has returned. Because you cannot feel it, you will  need to take extra care to avoid injury. Because it may be weak, you may have difficulty moving it or using it. You may not know what position it is in without looking at it while the block is in effect.  3. For blocks in the legs and feet, returning to weight bearing and walking needs to be done carefully. You will need to wait until the numbness is entirely gone and the strength has returned. You should be able to move your leg and foot normally before you try and bear weight or walk. You will need someone to be with you when you first try to ensure you do not fall and possibly risk injury.  4. Bruising and tenderness at the needle site are common side effects and will resolve in a few days.  5. Persistent numbness or new problems with movement should be communicated to the surgeon or the [place] [phone] [facility] [phone]).  -------------------------------   Thyroidectomy, Care After This sheet gives you information about how to care for yourself after your procedure. Your health care provider may also give you more specific instructions. If you have problems or questions, contact your health care provider. What can I expect after the procedure? After the procedure, it is common to have:  Mild pain in the neck or upper body, especially when swallowing.  A swollen neck.  A sore throat.  A weak or hoarse voice.  Slight tingling or numbness around your mouth, or in your fingers or toes. This may last for a day or two after surgery. This  condition is caused by low levels of calcium. You may be given calcium supplements to treat it. Follow these instructions at home:  Medicines  Take over-the-counter and prescription medicines only as told by your health care provider.  Do not drive or use heavy machinery while taking prescription pain medicine.  Do not take medicines that contain aspirin and ibuprofen until your health care provider says  that you can. These medicines can increase your risk of bleeding. Eating and drinking  Start slowly with eating. You may need to have only liquids and soft foods for a few days or as directed by your health care provider.  To prevent or treat constipation while you are taking prescription pain medicine, your health care provider may recommend that you: ? Drink enough fluid to keep your urine pale yellow. ? Take over-the-counter or prescription medicines. ? Eat foods that are high in fiber, such as fresh fruits and vegetables, whole grains, and beans. ? Limit foods that are high in fat and processed sugars, such as fried and sweet foods. Incision care  Follow instructions from your health care provider about how to take care of your incision. Make sure you: ? Leave stitches (sutures), skin glue, or adhesive strips in place. These skin closures may need to stay in place for 2 weeks or longer. If adhesive strip edges start to loosen and curl up, you may trim the loose edges. Do not remove adhesive strips completely unless your health care provider tells you to do that.  Check your incision area every day for signs of infection. Check for: ? Redness, swelling, or pain. ? Fluid or blood. ? Warmth. ? Pus or a bad smell. Activity  For the first 10 days after the procedure or as instructed by your health care provider: ? Do not lift anything that is heavier than 10 lb (4.5 kg). ? Do not jog, swim, or do other strenuous exercises. ? Do not play contact sports.  Avoid sitting for a long time without moving. Get up to take short walks every 1-2 hours. This is needed to improve blood flow and breathing. Ask for help if you feel weak or unsteady.  Return to your normal activities as told by your health care provider. Ask your health care provider what activities are safe for you. General instructions  Do not use any products that contain nicotine or tobacco, such as cigarettes and e-cigarettes.  These can delay healing after surgery. If you need help quitting, ask your health care provider.  Keep all follow-up visits as told by your health care provider. This is important. Your health care provider needs to monitor the calcium level in your blood to make sure that it does not become low. Contact a health care provider if you:  Have a fever.  Have more redness, swelling, or pain around your incision area.  Have fluid or blood coming from your incision area.  Notice that your incision area feels warm to the touch.  Have pus or a bad smell coming from your incision area.  Have trouble talking.  Have nausea or vomiting for more than 2 days. Get help right away if you:  Have trouble breathing.  Have trouble swallowing.  Develop a rash.  Develop a cough that gets worse.  Notice that your speech changes, or you have hoarseness that gets worse.  Develop numbness, tingling, or muscle spasms in the arms, hands, feet, or face. Summary  After the procedure, it is common to feel mild  pain in the neck or upper body, especially when swallowing.  Take medicines as told by your health care provider. These include pain medicines and thyroid hormones, if required.  Follow instructions from your health care provider about how to take care of your incision. Watch for signs of infection.  Keep all follow-up visits as told by your health care provider. This is important. Your health care provider needs to monitor the calcium level in your blood to make sure that it does not become low.  Get help right away if you develop difficulty breathing, or numbness, tingling, or muscle spasms in the arms, hands, feet, or face. This information is not intended to replace advice given to you by your health care provider. Make sure you discuss any questions you have with your health care provider. Document Revised: 04/23/2018 Document Reviewed: 12/31/2016 Elsevier Patient Education  2020 [employer].

## 2019-09-15 NOTE — Discharge Summary (Signed)
Physician Discharge Summary  Patient ID: Brenda [name] MRN: [id number] DOB/AGE: [birth year] 39 y.o.  Admit date: 09/14/2019 Discharge date: 09/15/2019  Admission Diagnoses: Left thyroid mass  Discharge Diagnoses: Left thyroid mass Active Problems:   S/P partial thyroidectomy   Discharged Condition: good  Hospital Course: Pt had an uneventful overnight stay. Pt tolerated po well. No bleeding. No stridor.  Consults: None  Significant Diagnostic Studies: None  Treatments: surgery: Left hemithyroidectomy  Discharge Exam: Blood pressure (!) 142/86, pulse 78, temperature 97.9 F (36.6 C), resp. rate 16, height 5\' 5"  (1.651 m), weight 62.5 kg, last menstrual period 08/13/2019, SpO2 100 %. Incision/Wound:c/d/i Voice is strong  Disposition: Discharge disposition: 01-Home or Self Care       Discharge Instructions    Activity as tolerated - No restrictions   Complete by: As directed    Diet general   Complete by: As directed    No wound care   Complete by: As directed      Allergies as of 09/15/2019   No Known Allergies     Medication List    TAKE these medications   amoxicillin 875 MG tablet Commonly known as: AMOXIL Take 1 tablet (875 mg total) by mouth 2 (two) times daily for 3 days.   escitalopram 10 MG tablet Commonly known as: Lexapro Take 1 tablet (10 mg total) by mouth daily.   oxyCODONE-acetaminophen 10-325 MG tablet Commonly known as: PERCOCET Take 1 tablet by mouth every 4 (four) hours as needed for up to 3 days for pain.       Follow-up Information    Leta Baptist, MD On 09/21/2019.   Specialty: Otolaryngology Why: at [employer] information: [place] [address] [phone]               Signed: Burley Saver 09/15/2019, 7:54 AM

## 2019-09-16 ENCOUNTER — Encounter (HOSPITAL_BASED_OUTPATIENT_CLINIC_OR_DEPARTMENT_OTHER): Payer: Self-pay | Admitting: Otolaryngology

## 2019-09-16 LAB — SURGICAL PATHOLOGY

## 2019-09-17 ENCOUNTER — Other Ambulatory Visit: Payer: Self-pay | Admitting: Otolaryngology

## 2019-09-20 ENCOUNTER — Encounter (HOSPITAL_COMMUNITY): Payer: Self-pay | Admitting: Otolaryngology

## 2019-09-20 ENCOUNTER — Other Ambulatory Visit: Payer: Self-pay

## 2019-09-20 NOTE — Progress Notes (Signed)
Made pre-op phone call to give instructions for day of surgery.  Patient's primary care is Dr. Lajuana Ripple with [facility].  No cardiologist or cardiac history.  Instructed to arrive at 7am on day of surgery.

## 2019-09-21 ENCOUNTER — Ambulatory Visit (HOSPITAL_COMMUNITY): Payer: 59 | Admitting: Certified Registered Nurse Anesthetist

## 2019-09-21 ENCOUNTER — Encounter (HOSPITAL_COMMUNITY)
Admission: RE | Disposition: A | Discharge status: Home / Self Care | Payer: Self-pay | Source: Home / Self Care | Attending: Otolaryngology

## 2019-09-21 ENCOUNTER — Encounter (HOSPITAL_COMMUNITY): Payer: Self-pay | Admitting: Otolaryngology

## 2019-09-21 ENCOUNTER — Ambulatory Visit (HOSPITAL_COMMUNITY)
Admission: RE | Admit: 2019-09-21 | Discharge: 2019-09-22 | Disposition: A | Discharge status: Home / Self Care | Payer: 59 | Attending: Otolaryngology | Admitting: Otolaryngology

## 2019-09-21 DIAGNOSIS — F419 Anxiety disorder, unspecified: Secondary | ICD-10-CM | POA: Diagnosis not present

## 2019-09-21 DIAGNOSIS — F329 Major depressive disorder, single episode, unspecified: Secondary | ICD-10-CM | POA: Insufficient documentation

## 2019-09-21 DIAGNOSIS — Z20822 Contact with and (suspected) exposure to covid-19: Secondary | ICD-10-CM | POA: Diagnosis not present

## 2019-09-21 DIAGNOSIS — C73 Malignant neoplasm of thyroid gland: Secondary | ICD-10-CM | POA: Insufficient documentation

## 2019-09-21 DIAGNOSIS — E890 Postprocedural hypothyroidism: Secondary | ICD-10-CM

## 2019-09-21 LAB — CBC
HCT: 42 % (ref 36.0–46.0)
Hemoglobin: 13.8 g/dL (ref 12.0–15.0)
MCH: 30.9 pg (ref 26.0–34.0)
MCHC: 32.9 g/dL (ref 30.0–36.0)
MCV: 94.2 fL (ref 80.0–100.0)
Platelets: 287 10*3/uL (ref 150–400)
RBC: 4.46 MIL/uL (ref 3.87–5.11)
RDW: 11.9 % (ref 11.5–15.5)
WBC: 7.1 10*3/uL (ref 4.0–10.5)
nRBC: 0 % (ref 0.0–0.2)

## 2019-09-21 LAB — POCT PREGNANCY, URINE: Preg Test, Ur: NEGATIVE

## 2019-09-21 LAB — CALCIUM
Calcium: 8.5 mg/dL — ABNORMAL LOW (ref 8.9–10.3)
Calcium: 8.7 mg/dL — ABNORMAL LOW (ref 8.9–10.3)

## 2019-09-21 LAB — SARS CORONAVIRUS 2 BY RT PCR (HOSPITAL ORDER, PERFORMED IN ~~LOC~~ HOSPITAL LAB): SARS Coronavirus 2: NEGATIVE

## 2019-09-21 SURGERY — THYROIDECTOMY, COMPLETION
Anesthesia: General | Site: Neck | Laterality: Right

## 2019-09-21 MED ORDER — LIDOCAINE-EPINEPHRINE 1 %-1:100000 IJ SOLN
INTRAMUSCULAR | Status: AC
  Filled 2019-09-21: qty 1

## 2019-09-21 MED ORDER — ONDANSETRON HCL 4 MG/2ML IJ SOLN
INTRAMUSCULAR | Status: AC
  Filled 2019-09-21: qty 2

## 2019-09-21 MED ORDER — AMISULPRIDE (ANTIEMETIC) 5 MG/2ML IV SOLN
10.0000 mg | Freq: Once | INTRAVENOUS | Status: AC | PRN
  Administered 2019-09-21: 10 mg via INTRAVENOUS

## 2019-09-21 MED ORDER — PROPOFOL 10 MG/ML IV BOLUS
INTRAVENOUS | Status: DC | PRN
  Administered 2019-09-21: 130 mg via INTRAVENOUS

## 2019-09-21 MED ORDER — CHLORHEXIDINE GLUCONATE 0.12 % MT SOLN
15.0000 mL | Freq: Once | OROMUCOSAL | Status: AC
  Administered 2019-09-21: 15 mL via OROMUCOSAL
  Filled 2019-09-21: qty 15

## 2019-09-21 MED ORDER — ORAL CARE MOUTH RINSE: 15.0000 mL | Freq: Once | OROMUCOSAL | Status: AC

## 2019-09-21 MED ORDER — PROPOFOL 10 MG/ML IV BOLUS
INTRAVENOUS | Status: AC
  Filled 2019-09-21: qty 20

## 2019-09-21 MED ORDER — ACETAMINOPHEN 160 MG/5ML PO SOLN: 1000.0000 mg | Freq: Once | ORAL | Status: DC | PRN

## 2019-09-21 MED ORDER — AMISULPRIDE (ANTIEMETIC) 5 MG/2ML IV SOLN: 10.0000 mg | Freq: Once | INTRAVENOUS | Status: DC

## 2019-09-21 MED ORDER — CEFAZOLIN SODIUM-DEXTROSE 2-4 GM/100ML-% IV SOLN
INTRAVENOUS | Status: AC
  Filled 2019-09-21: qty 100

## 2019-09-21 MED ORDER — CALCIUM CARBONATE-VITAMIN D 500-200 MG-UNIT PO TABS
2.0000 | ORAL_TABLET | Freq: Two times a day (BID) | ORAL | Status: DC
  Administered 2019-09-21: 2 via ORAL
  Filled 2019-09-21: qty 2

## 2019-09-21 MED ORDER — LACTATED RINGERS IV SOLN: INTRAVENOUS | Status: DC

## 2019-09-21 MED ORDER — LIDOCAINE 2% (20 MG/ML) 5 ML SYRINGE
INTRAMUSCULAR | Status: DC | PRN
  Administered 2019-09-21: 60 mg via INTRAVENOUS

## 2019-09-21 MED ORDER — AMISULPRIDE (ANTIEMETIC) 5 MG/2ML IV SOLN
INTRAVENOUS | Status: AC
  Filled 2019-09-21: qty 4

## 2019-09-21 MED ORDER — ESCITALOPRAM OXALATE 10 MG PO TABS
10.0000 mg | ORAL_TABLET | Freq: Every day | ORAL | Status: DC
  Administered 2019-09-21: 10 mg via ORAL
  Filled 2019-09-21 (×2): qty 1

## 2019-09-21 MED ORDER — ONDANSETRON HCL 4 MG/2ML IJ SOLN
INTRAMUSCULAR | Status: DC | PRN
  Administered 2019-09-21: 4 mg via INTRAVENOUS

## 2019-09-21 MED ORDER — FENTANYL CITRATE (PF) 100 MCG/2ML IJ SOLN
INTRAMUSCULAR | Status: DC | PRN
  Administered 2019-09-21: 100 ug via INTRAVENOUS
  Administered 2019-09-21 (×3): 50 ug via INTRAVENOUS

## 2019-09-21 MED ORDER — ACETAMINOPHEN 500 MG PO TABS: 1000.0000 mg | ORAL_TABLET | Freq: Once | ORAL | Status: DC | PRN

## 2019-09-21 MED ORDER — KCL IN DEXTROSE-NACL 20-5-0.45 MEQ/L-%-% IV SOLN: INTRAVENOUS | Status: DC

## 2019-09-21 MED ORDER — OXYCODONE HCL 5 MG/5ML PO SOLN: 5.0000 mg | Freq: Once | ORAL | Status: DC | PRN

## 2019-09-21 MED ORDER — ACETAMINOPHEN 10 MG/ML IV SOLN
INTRAVENOUS | Status: AC
  Filled 2019-09-21: qty 100

## 2019-09-21 MED ORDER — MIDAZOLAM HCL 5 MG/5ML IJ SOLN
INTRAMUSCULAR | Status: DC | PRN
  Administered 2019-09-21: 2 mg via INTRAVENOUS

## 2019-09-21 MED ORDER — 0.9 % SODIUM CHLORIDE (POUR BTL) OPTIME
TOPICAL | Status: DC | PRN
  Administered 2019-09-21: 1000 mL

## 2019-09-21 MED ORDER — FENTANYL CITRATE (PF) 250 MCG/5ML IJ SOLN
INTRAMUSCULAR | Status: AC
  Filled 2019-09-21: qty 5

## 2019-09-21 MED ORDER — ACETAMINOPHEN 10 MG/ML IV SOLN: 1000.0000 mg | Freq: Once | INTRAVENOUS | Status: DC | PRN

## 2019-09-21 MED ORDER — SUCCINYLCHOLINE CHLORIDE 20 MG/ML IJ SOLN
INTRAMUSCULAR | Status: DC | PRN
  Administered 2019-09-21: 100 mg via INTRAVENOUS

## 2019-09-21 MED ORDER — PHENYLEPHRINE 40 MCG/ML (10ML) SYRINGE FOR IV PUSH (FOR BLOOD PRESSURE SUPPORT)
PREFILLED_SYRINGE | INTRAVENOUS | Status: AC
  Filled 2019-09-21: qty 10

## 2019-09-21 MED ORDER — OXYCODONE-ACETAMINOPHEN 5-325 MG PO TABS
1.0000 | ORAL_TABLET | ORAL | Status: DC | PRN
  Administered 2019-09-21 – 2019-09-22 (×4): 1 via ORAL
  Filled 2019-09-21: qty 2
  Filled 2019-09-21: qty 1
  Filled 2019-09-21: qty 2
  Filled 2019-09-21: qty 1

## 2019-09-21 MED ORDER — FENTANYL CITRATE (PF) 100 MCG/2ML IJ SOLN
25.0000 ug | INTRAMUSCULAR | Status: DC | PRN
  Administered 2019-09-21: 25 ug via INTRAVENOUS

## 2019-09-21 MED ORDER — LIDOCAINE-EPINEPHRINE 1 %-1:100000 IJ SOLN
INTRAMUSCULAR | Status: DC | PRN
  Administered 2019-09-21: 2 mL

## 2019-09-21 MED ORDER — MIDAZOLAM HCL 2 MG/2ML IJ SOLN
INTRAMUSCULAR | Status: AC
  Filled 2019-09-21: qty 2

## 2019-09-21 MED ORDER — MORPHINE SULFATE (PF) 2 MG/ML IV SOLN: 2.0000 mg | INTRAVENOUS | Status: DC | PRN

## 2019-09-21 MED ORDER — FENTANYL CITRATE (PF) 100 MCG/2ML IJ SOLN
INTRAMUSCULAR | Status: AC
  Filled 2019-09-21: qty 2

## 2019-09-21 MED ORDER — PHENYLEPHRINE HCL-NACL 10-0.9 MG/250ML-% IV SOLN
INTRAVENOUS | Status: DC | PRN
Start: 2019-09-21 — End: 2019-09-21
  Administered 2019-09-21: 35 ug/min via INTRAVENOUS

## 2019-09-21 MED ORDER — OXYCODONE HCL 5 MG PO TABS: 5.0000 mg | ORAL_TABLET | Freq: Once | ORAL | Status: DC | PRN

## 2019-09-21 MED ORDER — ACETAMINOPHEN 10 MG/ML IV SOLN
INTRAVENOUS | Status: DC | PRN
Start: 2019-09-21 — End: 2019-09-21
  Administered 2019-09-21: 1000 mg via INTRAVENOUS

## 2019-09-21 MED ORDER — DEXAMETHASONE SODIUM PHOSPHATE 10 MG/ML IJ SOLN
INTRAMUSCULAR | Status: DC | PRN
  Administered 2019-09-21: 10 mg via INTRAVENOUS

## 2019-09-21 MED ORDER — CEFAZOLIN SODIUM-DEXTROSE 2-3 GM-%(50ML) IV SOLR
INTRAVENOUS | Status: DC | PRN
  Administered 2019-09-21: 2 g via INTRAVENOUS

## 2019-09-21 SURGICAL SUPPLY — 50 items
ATTRACTOMAT 16X20 MAGNETIC DRP (DRAPES) IMPLANT
BLADE CLIPPER SURG (BLADE) IMPLANT
BLADE SURG 15 STRL LF DISP TIS (BLADE) IMPLANT
BLADE SURG 15 STRL SS (BLADE)
CANISTER SUCT 3000ML PPV (MISCELLANEOUS) ×3 IMPLANT
CLEANER TIP ELECTROSURG 2X2 (MISCELLANEOUS) ×3 IMPLANT
CLIP VESOCCLUDE SM WIDE 24/CT (CLIP) IMPLANT
CNTNR URN SCR LID CUP LEK RST (MISCELLANEOUS) IMPLANT
CONT SPEC 4OZ STRL OR WHT (MISCELLANEOUS)
CORD BIPOLAR FORCEPS 12FT (ELECTRODE) ×3 IMPLANT
COVER SURGICAL LIGHT HANDLE (MISCELLANEOUS) ×3 IMPLANT
COVER WAND RF STERILE (DRAPES) ×3 IMPLANT
DERMABOND ADVANCED (GAUZE/BANDAGES/DRESSINGS) ×2
DERMABOND ADVANCED .7 DNX12 (GAUZE/BANDAGES/DRESSINGS) ×1 IMPLANT
DRAIN CHANNEL 10F 3/8 F FF (DRAIN) ×6 IMPLANT
DRAPE HALF SHEET 40X57 (DRAPES) ×3 IMPLANT
ELECT COATED BLADE 2.86 ST (ELECTRODE) ×3 IMPLANT
ELECT REM PT RETURN 9FT ADLT (ELECTROSURGICAL) ×3
ELECTRODE REM PT RTRN 9FT ADLT (ELECTROSURGICAL) ×1 IMPLANT
EVACUATOR SILICONE 100CC (DRAIN) ×6 IMPLANT
FORCEPS BIPOLAR SPETZLER 8 1.0 (NEUROSURGERY SUPPLIES) ×3 IMPLANT
GAUZE 4X4 16PLY RFD (DISPOSABLE) ×6 IMPLANT
GLOVE BIO SURGEON STRL SZ 6.5 (GLOVE) ×2 IMPLANT
GLOVE BIO SURGEONS STRL SZ 6.5 (GLOVE) ×1
GLOVE BIOGEL PI IND STRL 6.5 (GLOVE) ×1 IMPLANT
GLOVE BIOGEL PI IND STRL 7.5 (GLOVE) ×1 IMPLANT
GLOVE BIOGEL PI INDICATOR 6.5 (GLOVE) ×2
GLOVE BIOGEL PI INDICATOR 7.5 (GLOVE) ×2
GLOVE ECLIPSE 7.5 STRL STRAW (GLOVE) ×3 IMPLANT
GOWN STRL REUS W/ TWL LRG LVL3 (GOWN DISPOSABLE) ×3 IMPLANT
GOWN STRL REUS W/TWL LRG LVL3 (GOWN DISPOSABLE) ×6
HEMOSTAT SURGICEL 2X14 (HEMOSTASIS) IMPLANT
KIT BASIN OR (CUSTOM PROCEDURE TRAY) ×3 IMPLANT
KIT TURNOVER KIT B (KITS) ×3 IMPLANT
NEEDLE HYPO 25GX1X1/2 BEV (NEEDLE) ×3 IMPLANT
NS IRRIG 1000ML POUR BTL (IV SOLUTION) ×3 IMPLANT
PAD ARMBOARD 7.5X6 YLW CONV (MISCELLANEOUS) ×3 IMPLANT
PENCIL SMOKE EVACUATOR (MISCELLANEOUS) ×3 IMPLANT
POSITIONER HEAD DONUT 9IN (MISCELLANEOUS) ×3 IMPLANT
PROBE NERVBE PRASS .33 (MISCELLANEOUS) ×3 IMPLANT
SET WALTER ACTIVATION W/DRAPE (SET/KITS/TRAYS/PACK) ×3 IMPLANT
SHEARS HARMONIC 9CM CVD (BLADE) ×3 IMPLANT
SPONGE INTESTINAL PEANUT (DISPOSABLE) ×6 IMPLANT
SUT ETHILON 2 0 FS 18 (SUTURE) ×3 IMPLANT
SUT SILK 2 0 PERMA HAND 18 BK (SUTURE) ×3 IMPLANT
SUT SILK 2 0 REEL (SUTURE) ×3 IMPLANT
SUT SILK 3 0 REEL (SUTURE) ×3 IMPLANT
SUT VICRYL 4-0 PS2 18IN ABS (SUTURE) ×3 IMPLANT
TRAY ENT MC OR (CUSTOM PROCEDURE TRAY) ×3 IMPLANT
TRAY FOLEY MTR SLVR 14FR STAT (SET/KITS/TRAYS/PACK) IMPLANT

## 2019-09-21 NOTE — Op Note (Signed)
DATE OF PROCEDURE:  09/21/2019                              OPERATIVE REPORT  SURGEON:  Leta Baptist, MD  PREOPERATIVE DIAGNOSES: 1. Thyroid cancer   POSTOPERATIVE DIAGNOSES: 1. Thyroid cancer   PROCEDURE PERFORMED: Completion thyroidectomy.   ANESTHESIA:  General endotracheal tube anesthesia.   COMPLICATIONS:  None.   ESTIMATED BLOOD LOSS: 10 ml   INDICATION FOR PROCEDURE: The patient is a 39 y.o. female with a history of a large left thyroid nodule. The patient underwent left hemithyroidectomy surgery last week.  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 Based on the above findings, the decision was made for the patient to undergo the completion thyroidectomy procedure.  The risks, benefits, alternatives, and details of the procedure were discussed with the patient.  Questions were invited and answered.  Informed consent was obtained.   DESCRIPTION:  The patient was taken to the operating room and placed supine on the operating table.  General endotracheal tube anesthesia was administered by the anesthesiologist.  A nerve monitoring endotracheal tube was used.  The nerve monitoring system was functional throughout the case.   The patient was positioned and prepped and draped in the standard fashion for thyroid surgery.  1% lidocaine with 1 100,000 epinephrine was infiltrated at the planned site of incision.  A transverse lower neck incision was made through the previous incision.  The incision was carried down to the level of the platysma muscles.  Superiorly based and inferiorly based subplatysmal flaps were elevated in the standard fashion.  The strap muscles were divided at midline, and retracted laterally, exposing the remaining right thyroid lobe.    Careful dissection was performed to free the right thyroid lobe from the surrounding soft tissue.  The right recurrent laryngeal nerve was identified and preserved.  The nerve was noted to be  functional throughout the case.  An inferior right parathyroid gland was also identified and preserved.  The entire right thyroid lobe was resected and sent to the pathology department for permanent histologic identification.  A #10 JP drain was placed.  The strap muscles were reapproximated with 4-0 Vicryl sutures.  The incision was closed in layers with 4-0 Vicryl and Dermabond.   The care of the patient was turned over to the anesthesiologist.  The patient was awakened from anesthesia without difficulty.  The patient was extubated and transferred to the recovery room in good condition.   OPERATIVE FINDINGS:  A normal appearing right thyroid lobe.   SPECIMEN: Right thyroid lobe   FOLLOWUP CARE:  The patient will be admitted for overnight observation. Her calcium level will be monitored.  Lorilee Cafarella W Kysha Muralles 09/21/2019 11:33 AM

## 2019-09-21 NOTE — Transfer of Care (Signed)
Immediate Anesthesia Transfer of Care Note  Patient: Brenda [name]  Procedure(s) Performed: COMPLETION  THYROIDECTOMY (Right Neck)  Patient Location: PACU  Anesthesia Type:General  Level of Consciousness: awake and drowsy  Airway & Oxygen Therapy: Patient Spontanous Breathing and Patient connected to nasal cannula oxygen  Post-op Assessment: Report given to RN and Post -op Vital signs reviewed and stable  Post vital signs: Reviewed and stable  Last Vitals:  Vitals Value Taken Time  BP 134/90 09/21/19 1142  Temp    Pulse 67 09/21/19 1144  Resp 18 09/21/19 1144  SpO2 100 % 09/21/19 1144  Vitals shown include unvalidated device data.  Last Pain:  Vitals:   09/21/19 0819  TempSrc:   PainSc: 1       Patients Stated Pain Goal: 0 (84/03/35 3317)  Complications: No complications documented.

## 2019-09-21 NOTE — Interval H&P Note (Signed)
History and Physical Interval Note:  09/21/2019 7:31 AM  Brenda [name]  has presented today for surgery, with the diagnosis of THYROID CANCER.  The various methods of treatment have been discussed with the patient and family. After consideration of risks, benefits and other options for treatment, the patient has consented to  Procedure(s): COMPLETION THYROIDECTOMY as a surgical intervention.  The patient's history has been reviewed, patient examined, no change in status, stable for surgery.  I have reviewed the patient's chart and labs.  Questions were answered to the patient's satisfaction.     Keller Bounds Cassie Freer

## 2019-09-21 NOTE — Anesthesia Procedure Notes (Signed)
Procedure Name: Intubation Date/Time: 09/21/2019 9:41 AM Performed by: Inda Coke, CRNA Pre-anesthesia Checklist: Patient identified, Emergency Drugs available, Suction available and Patient being monitored Patient Re-evaluated:Patient Re-evaluated prior to induction Oxygen Delivery Method: Circle System Utilized Preoxygenation: Pre-oxygenation with 100% oxygen Induction Type: IV induction Ventilation: Mask ventilation without difficulty Laryngoscope Size: Glidescope and 3 Grade View: Grade I Tube type: Oral Tube size: 7.0 mm Number of attempts: 1 Airway Equipment and Method: Stylet and Oral airway Placement Confirmation: ETT inserted through vocal cords under direct vision,  positive ETCO2 and breath sounds checked- equal and bilateral Secured at: 23 cm Tube secured with: Tape Dental Injury: Teeth and Oropharynx as per pre-operative assessment  Comments: Elective glidescope intubation for placement of NIMS tube

## 2019-09-22 DIAGNOSIS — C73 Malignant neoplasm of thyroid gland: Secondary | ICD-10-CM | POA: Diagnosis not present

## 2019-09-22 LAB — SURGICAL PATHOLOGY

## 2019-09-22 LAB — CALCIUM: Calcium: 9 mg/dL (ref 8.9–10.3)

## 2019-09-22 MED ORDER — AMOXICILLIN 875 MG PO TABS
875.0000 mg | ORAL_TABLET | Freq: Two times a day (BID) | ORAL | 0 refills | Status: AC
Start: 2019-09-22 — End: 2019-09-25

## 2019-09-22 MED ORDER — CALCIUM CARBONATE-VITAMIN D 500-200 MG-UNIT PO TABS: 1.0000 | ORAL_TABLET | Freq: Two times a day (BID) | ORAL | 5 refills | Status: DC

## 2019-09-22 MED ORDER — OXYCODONE-ACETAMINOPHEN 10-325 MG PO TABS: 1.0000 | ORAL_TABLET | ORAL | 0 refills | Status: AC | PRN

## 2019-09-22 MED ORDER — LEVOTHYROXINE SODIUM 75 MCG PO TABS
75.0000 ug | ORAL_TABLET | Freq: Every day | ORAL | 5 refills | Status: DC
Start: 2019-09-22 — End: 2019-09-30

## 2019-09-22 NOTE — Progress Notes (Signed)
Patient is discharged from room 3C07 at this time. Alert and in stable condition. IV site d/c'd and instructions read to patient and spouse with understanding verbalized and all questions answered. Left unit via wheelchair with all belongings at side. 

## 2019-09-22 NOTE — Discharge Summary (Signed)
Physician Discharge Summary  Patient ID: Brenda [name] MRN: [id number] DOB/AGE: Oct 16, 1980 39 y.o.  Admit date: 09/21/2019 Discharge date: 09/22/2019  Admission Diagnoses: Thyroid cancer  Discharge Diagnoses: Thyroid cancer Active Problems:   S/P complete thyroidectomy   Discharged Condition: good  Hospital Course: Pt had an uneventful overnight stay. Pt tolerated po well. No bleeding. No stridor. Voice is strong.  Consults: None  Significant Diagnostic Studies: None  Treatments: surgery: Completion thyroidectomy  Discharge Exam: Blood pressure 134/88, pulse 64, temperature 98.3 F (36.8 C), temperature source Oral, resp. rate 18, height 5\' 5"  (1.651 m), weight 59 kg, last menstrual period 09/14/2019, SpO2 100 %. Incision/Wound:c/d/i. Voice is strong  Disposition: Discharge disposition: 01-Home or Self Care       Discharge Instructions    Activity as tolerated - No restrictions   Complete by: As directed    Diet general   Complete by: As directed    No wound care   Complete by: As directed      Allergies as of 09/22/2019   No Known Allergies     Medication List    TAKE these medications   amoxicillin 875 MG tablet Commonly known as: AMOXIL Take 1 tablet (875 mg total) by mouth 2 (two) times daily for 3 days.   calcium-vitamin D 500-200 MG-UNIT tablet Commonly known as: OSCAL WITH D Take 1 tablet by mouth 2 (two) times daily.   escitalopram 10 MG tablet Commonly known as: Lexapro Take 1 tablet (10 mg total) by mouth daily.   levothyroxine 75 MCG tablet Commonly known as: Synthroid Take 1 tablet (75 mcg total) by mouth daily before breakfast.   oxyCODONE-acetaminophen 10-325 MG tablet Commonly known as: PERCOCET Take 1 tablet by mouth every 4 (four) hours as needed for up to 3 days for pain.       Follow-up Information    Leta Baptist, MD On 09/28/2019.   Specialty: Otolaryngology Why: at [employer] information: [address] [place] [address] [phone]               Signed: Burley Saver 09/22/2019, 7:52 AM

## 2019-09-23 NOTE — Anesthesia Preprocedure Evaluation (Signed)
Anesthesia Evaluation  Patient identified by MRN, date of birth, ID band Patient awake    Reviewed: Allergy & Precautions, NPO status , Patient's Chart, lab work & pertinent test results  History of Anesthesia Complications Negative for: history of anesthetic complications  Airway Mallampati: I  TM Distance: >3 FB Neck ROM: Full    Dental  (+) Teeth Intact, Dental Advisory Given   Pulmonary neg sleep apnea, neg recent URI,    breath sounds clear to auscultation       Cardiovascular negative cardio ROS   Rhythm:Regular Rate:Normal     Neuro/Psych PSYCHIATRIC DISORDERS Anxiety Depression negative neurological ROS     GI/Hepatic negative GI ROS, Neg liver ROS,   Endo/Other  THYROID CANCER  Renal/GU negative Renal ROS     Musculoskeletal negative musculoskeletal ROS (+)   Abdominal Normal abdominal exam  (+)   Peds  Hematology negative hematology ROS (+)   Anesthesia Other Findings   Reproductive/Obstetrics                             Anesthesia Physical Anesthesia Plan  ASA: II  Anesthesia Plan: General   Post-op Pain Management:    Induction: Intravenous  PONV Risk Score and Plan: 3 and Dexamethasone and Ondansetron  Airway Management Planned: Oral ETT  Additional Equipment: None  Intra-op Plan:   Post-operative Plan: Extubation in OR  Informed Consent: I have reviewed the patients History and Physical, chart, labs and discussed the procedure including the risks, benefits and alternatives for the proposed anesthesia with the patient or authorized representative who has indicated his/her understanding and acceptance.     Dental advisory given  Plan Discussed with: CRNA and Surgeon  Anesthesia Plan Comments:         Anesthesia Quick Evaluation

## 2019-09-23 NOTE — Anesthesia Postprocedure Evaluation (Signed)
Anesthesia Post Note  Patient: Brenda [name]  Procedure(s) Performed: COMPLETION  THYROIDECTOMY (Right Neck)     Patient location during evaluation: PACU Anesthesia Type: General Level of consciousness: awake and alert Pain management: pain level controlled Vital Signs Assessment: post-procedure vital signs reviewed and stable Respiratory status: spontaneous breathing, nonlabored ventilation, respiratory function stable and patient connected to nasal cannula oxygen Cardiovascular status: blood pressure returned to baseline and stable Postop Assessment: no apparent nausea or vomiting Anesthetic complications: no   No complications documented.  Last Vitals:  Vitals:   09/22/19 0357 09/22/19 0734  BP: 129/78 134/88  Pulse: 65 64  Resp: 16 18  Temp: 36.7 C 36.8 C  SpO2: 100% 100%    Last Pain:  Vitals:   09/22/19 0800  TempSrc:   PainSc: 3                  Brenda [name]

## 2019-09-28 ENCOUNTER — Other Ambulatory Visit: Payer: Self-pay

## 2019-09-28 ENCOUNTER — Other Ambulatory Visit: Payer: 59

## 2019-09-28 ENCOUNTER — Other Ambulatory Visit (INDEPENDENT_AMBULATORY_CARE_PROVIDER_SITE_OTHER): Payer: Self-pay | Admitting: Otolaryngology

## 2019-09-29 LAB — CALCIUM: Calcium: 9.2 mg/dL (ref 8.7–10.2)

## 2019-09-29 LAB — T4, FREE: Free T4: 1.06 ng/dL (ref 0.82–1.77)

## 2019-09-29 LAB — TSH: TSH: 5.03 u[IU]/mL — ABNORMAL HIGH (ref 0.450–4.500)

## 2019-09-30 ENCOUNTER — Ambulatory Visit (INDEPENDENT_AMBULATORY_CARE_PROVIDER_SITE_OTHER): Payer: 59 | Admitting: "Endocrinology

## 2019-09-30 ENCOUNTER — Other Ambulatory Visit: Payer: Self-pay

## 2019-09-30 ENCOUNTER — Encounter: Payer: Self-pay | Admitting: "Endocrinology

## 2019-09-30 VITALS — BP 132/95 | HR 80 | Ht 65.0 in | Wt 137.0 lb

## 2019-09-30 DIAGNOSIS — E890 Postprocedural hypothyroidism: Secondary | ICD-10-CM

## 2019-09-30 DIAGNOSIS — C73 Malignant neoplasm of thyroid gland: Secondary | ICD-10-CM | POA: Insufficient documentation

## 2019-09-30 HISTORY — DX: Malignant neoplasm of thyroid gland: C73

## 2019-09-30 NOTE — Progress Notes (Signed)
Endocrinology Consult Note                                            09/30/2019, 6:42 PM   Subjective:    Patient ID: Brenda [name], female    DOB: 28-May-1980, PCP Brenda [name], Brenda [name]   Past Medical History:  Diagnosis Date  . Anxiety    Past Surgical History:  Procedure Laterality Date  . APPENDECTOMY    . THYROIDECTOMY Left 09/14/2019   Procedure: LEFT HEMITHYROIDECTOMY;  Surgeon: Brenda Baptist, MD;  Location: [facility];  Service: ENT;  Laterality: Left;   Social History   Socioeconomic History  . Marital status: Married    Spouse name: Brenda [name]  . Number of children: 2  . Years of education: some college  . 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 Smoking status: Never Smoker  . Smokeless tobacco: Never Used  Vaping Use  . Vaping Use: Never used  Substance and Sexual Activity  . Alcohol use: Never  . Drug use: Yes    Types: Marijuana  . Sexual activity: Yes    Birth control/protection: Surgical    Comment: tubal  Other Topics Concern  . Not on file  Social History Narrative  . Not on file   Social Determinants of Health   Financial Resource Strain:   . Difficulty of Paying Living Expenses:   Food Insecurity:   . Worried About Charity fundraiser in the Last Year:   . Arboriculturist in the Last Year:   Transportation Needs:   . Film/video editor (Medical):   Marland Kitchen Lack of Transportation (Non-Medical):   Physical Activity:   . Days of Exercise per Week:   . Minutes of Exercise per Session:   Stress:   . Feeling of Stress :   Social Connections:   . Frequency of Communication with Friends and Family:   . Frequency of Social Gatherings with Friends and Family:   . Attends Religious Services:   . Active Member of Clubs or Organizations:   . Attends Archivist Meetings:   Marland Kitchen Marital Status:    Family History  Problem Relation Age of Onset  . Anxiety  disorder Mother   . Asthma Mother   . COPD Mother   . Hypertension Mother   . Hyperlipidemia Mother   . Osteoporosis Mother   . Diabetes Mother   . Heart disease Father   . Heart attack Father   . Bipolar disorder Sister   . Schizophrenia Sister   . Drug abuse Sister   . Hyperlipidemia Maternal Grandmother   . Hypertension Maternal Grandmother   . Stroke Maternal Grandmother   . Alzheimer's disease Maternal Grandmother   . Cancer Maternal Grandfather   . Heart disease Maternal Grandfather   . Kidney disease Paternal Grandmother   . Cancer Paternal Grandfather   . Drug abuse Brother   . Anxiety disorder Brother   . Depression Brother   . Thyroid disease Brother   . Asthma Son    Outpatient Encounter Medications as of 09/30/2019  Medication Sig  . escitalopram (LEXAPRO) 10 MG tablet Take 1 tablet (10 mg total) by mouth daily.  Marland Kitchen levothyroxine (SYNTHROID) 100 MCG tablet Take 1 tablet by mouth  daily.  . [DISCONTINUED] calcium-vitamin D (OSCAL WITH D) 500-200 MG-UNIT tablet Take 1 tablet by mouth 2 (two) times daily.  . [DISCONTINUED] levothyroxine (SYNTHROID) 75 MCG tablet Take 1 tablet (75 mcg total) by mouth daily before breakfast.   No facility-administered encounter medications on file as of 09/30/2019.   ALLERGIES: No Known Allergies  VACCINATION STATUS:  There is no immunization history on file for this patient.  HPI Brenda [name] is 39 y.o. female who presents today with a medical history as above. she is being seen in consultation for papillary thyroid cancer and postsurgical hypothyroidism requested by Brenda [name], Brenda [name].   -History is obtained directly from the patient. She is recovering from her recent surgery. 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 A sample was sent for Afirma molecular study which was significant for  > 99% risk of malignancy reported on Aug 03, 2019. 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 Patient has done very well following her surgery, required calcium supplement briefly. She is currently on levothyroxine 100 mcg p.o. daily. She denies any family history of thyroid malignancy, however some thyroid dysfunction in a half brother. She denies cigarette smoking, however marijuana smoker. She denies dysphagia, shortness of breath, voice change. She denies palpitations, tremors, nor heat/cold intolerance. She reports steady body weight.  Review of Systems  Constitutional: no recent weight gain/loss, + fatigue, no subjective hyperthermia, no subjective hypothermia Eyes: no blurry vision, no xerophthalmia ENT: no sore throat, no nodules palpated in throat, no dysphagia/odynophagia, no hoarseness Cardiovascular: no Chest Pain, no Shortness of Breath, no palpitations, no leg swelling Respiratory: no cough, no shortness of breath Gastrointestinal: no Nausea/Vomiting/Diarhhea Musculoskeletal: no muscle/joint aches Skin: no rashes Neurological: no tremors, no numbness, no tingling, no dizziness Psychiatric: no depression, no anxiety  Objective:    Vitals with BMI 09/30/2019 09/22/2019 09/22/2019  Height 5' 5"  - -  Weight 137 lbs - -  BMI 90.2 - -  Systolic 409 735 329  Diastolic 95 88 78  Pulse 80 64 65    BP (!) 132/95   Pulse 80   Ht 5' 5"  (1.651 [name])   Wt 137 lb (62.1 kg)   LMP 09/14/2019   BMI 22.80 kg/[name]   Wt Readings from Last 3 Encounters:  09/30/19 137 lb (62.1 kg)  09/21/19 130 lb (59 kg)  09/14/19 137 lb 12.6 oz (62.5 kg)    Physical Exam  Constitutional:  Body mass index is 22.8 kg/[name].,  not in acute distress, normal state of mind Eyes: PERRLA, EOMI, no exophthalmos ENT: moist mucous membranes, + healing post thyroidectomy scar on  anterior lower neck, no gross cervical lymphadenopathy Cardiovascular: normal precordial activity, Regular Rate and Rhythm, no Murmur/Rubs/Gallops Respiratory:  adequate breathing efforts, no gross chest deformity, Clear to auscultation bilaterally Gastrointestinal: abdomen soft, Non -tender, No distension, Bowel Sounds present, no gross organomegaly Musculoskeletal: no gross deformities, strength intact in all four extremities Skin: moist, warm, no rashes Neurological: no tremor with outstretched hands, Deep tendon reflexes normal in bilateral lower extremities.  CMP ( most recent) CMP     Component Value Date/Time   NA 143 07/02/2019 1058   K 4.5 07/02/2019 1058   CL 100 07/02/2019 1058   CO2  23 07/02/2019 1058   GLUCOSE 85 07/02/2019 1058   BUN 8 07/02/2019 1058   CREATININE 0.73 07/02/2019 1058   CALCIUM 9.0 09/22/2019 0352   PROT 7.3 07/02/2019 1058   ALBUMIN 4.5 07/02/2019 1058   AST 20 07/02/2019 1058   ALT 12 07/02/2019 1058   ALKPHOS 61 07/02/2019 1058   BILITOT 0.2 07/02/2019 1058   GFRNONAA 106 07/02/2019 1058   GFRAA 122 07/02/2019 1058     Lipid Panel ( most recent) Lipid Panel     Component Value Date/Time   CHOL 157 07/02/2019 1058   TRIG 73 07/02/2019 1058   HDL 42 07/02/2019 1058   CHOLHDL 3.7 07/02/2019 1058   LDLCALC 101 (H) 07/02/2019 1058   LABVLDL 14 07/02/2019 1058      Lab Results  Component Value Date   TSH 1.960 07/02/2019      Assessment & Plan:   1. Postsurgical hypothyroidism 2. Malignant neoplasm of thyroid gland ([place]) - Brenda [name]  is being seen at a kind request of Brenda Doss [name], Brenda [name]. - I have reviewed her available thyroid records and clinically evaluated the patient. 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   Pathologic Stage  Classification (pTNM, AJCC 8th Edition): pT2, pN not  assigned (no nodes submitted or found)   She is recovering well, while continuing on thyroid hormone replacement with 100 mcg of levothyroxine.   -In light of her follicular variant PTC, stage 2 cancer, relative youth of the patient, she will be considered for adjuvant therapy with I-131 thyroid remnant ablation. This treatment will be facilitated by utilizing Thyrogen injections, followed by post therapy whole-body scan.  -This will be scheduled to be done at [facility] in the next several weeks.  -Regarding her postsurgical hypothyroidism, her recent thyroid function labs are consistent with appropriate suppressive therapy. She is advised to continue levothyroxine 100 mcg daily before breakfast.   - We discussed about the correct intake of her thyroid hormone, on empty stomach at fasting, with water, separated by at least 30 minutes from breakfast and other medications,  and separated by more than 4 hours from calcium, iron, multivitamins, acid reflux medications (PPIs). -Patient is made aware of the fact that thyroid hormone replacement is needed for life, dose to be adjusted by periodic monitoring of thyroid function tests.  -She has a mild complaint of muscle cramps, her recent calcium was 9 after 2 to 3 weeks of supplement with calcium. She is advised to resume OsCal 500/200 mg unit tablets once a day.  - I did not initiate any new prescriptions today. - she is advised to maintain close follow up with Brenda [name], Brenda [name] for primary care needs.   - Time spent with the patient: 60 minutes, of which >50% was spent in  counseling her about her follicular variant papillary thyroid cancer stage 2, postsurgical hypothyroidism and the rest in obtaining information about her symptoms, reviewing her previous labs/studies ( including abstractions from other facilities),  evaluations, and treatments,  and developing a plan to confirm  diagnosis and long term treatment based on the latest standards of care/guidelines; and documenting her care.  Brenda [name] participated in the discussions, expressed understanding, and voiced agreement with the above plans.  All questions were answered to  her satisfaction. she is encouraged to contact clinic should she have any questions or concerns prior to her return visit.  Follow up plan: Return in about 6 weeks (around 11/11/2019) for F/U with Whole Body Scan w/Thyrogen, F/U with Pre-visit Labs.   Glade Lloyd, MD [facility] [address] [place], [place] [address] Phone: [phone]  Fax: [phone]     09/30/2019, 6:42 PM  This note was partially dictated with voice recognition software. Similar sounding words can be transcribed inadequately or may not  be corrected upon review.

## 2019-10-01 ENCOUNTER — Telehealth: Payer: Self-pay

## 2019-10-01 NOTE — Telephone Encounter (Signed)
Does pt need ablation along with WBS

## 2019-10-01 NOTE — Telephone Encounter (Signed)
Yes, the usual initial ablation and post therapy WBS .

## 2019-10-05 ENCOUNTER — Other Ambulatory Visit: Payer: Self-pay | Admitting: "Endocrinology

## 2019-10-05 DIAGNOSIS — C73 Malignant neoplasm of thyroid gland: Secondary | ICD-10-CM

## 2019-10-05 NOTE — Progress Notes (Signed)
nm

## 2019-10-07 ENCOUNTER — Other Ambulatory Visit: Payer: Self-pay

## 2019-10-07 ENCOUNTER — Other Ambulatory Visit: Payer: 59

## 2019-10-07 ENCOUNTER — Other Ambulatory Visit (INDEPENDENT_AMBULATORY_CARE_PROVIDER_SITE_OTHER): Payer: Self-pay | Admitting: Otolaryngology

## 2019-10-08 LAB — T4, FREE: Free T4: 1.72 ng/dL (ref 0.82–1.77)

## 2019-10-08 LAB — TSH: TSH: 3.94 u[IU]/mL (ref 0.450–4.500)

## 2019-10-08 LAB — CALCIUM: Calcium: 9.6 mg/dL (ref 8.7–10.2)

## 2019-10-12 ENCOUNTER — Encounter: Payer: Self-pay | Admitting: Family Medicine

## 2019-11-10 ENCOUNTER — Ambulatory Visit (HOSPITAL_COMMUNITY): Payer: 59

## 2019-11-11 ENCOUNTER — Ambulatory Visit (HOSPITAL_COMMUNITY): Payer: 59

## 2019-11-11 ENCOUNTER — Ambulatory Visit: Payer: 59 | Admitting: "Endocrinology

## 2019-11-12 ENCOUNTER — Ambulatory Visit (HOSPITAL_COMMUNITY): Payer: 59

## 2019-11-17 ENCOUNTER — Encounter (HOSPITAL_COMMUNITY)
Admission: RE | Admit: 2019-11-17 | Discharge: 2019-11-17 | Disposition: A | Discharge status: Home / Self Care | Payer: 59 | Source: Ambulatory Visit | Attending: "Endocrinology | Admitting: "Endocrinology

## 2019-11-17 ENCOUNTER — Other Ambulatory Visit: Payer: Self-pay

## 2019-11-17 DIAGNOSIS — C73 Malignant neoplasm of thyroid gland: Secondary | ICD-10-CM

## 2019-11-17 MED ORDER — THYROTROPIN ALFA 1.1 MG IM SOLR
INTRAMUSCULAR | Status: AC
  Administered 2019-11-17: 0.9 mg via INTRAMUSCULAR
  Filled 2019-11-17: qty 0.9

## 2019-11-17 MED ORDER — THYROTROPIN ALFA 1.1 MG IM SOLR: 0.9000 mg | INTRAMUSCULAR | Status: AC

## 2019-11-17 MED ORDER — STERILE WATER FOR INJECTION IJ SOLN
INTRAMUSCULAR | Status: AC
  Filled 2019-11-17: qty 10

## 2019-11-18 ENCOUNTER — Encounter (HOSPITAL_COMMUNITY)
Admission: RE | Admit: 2019-11-18 | Discharge: 2019-11-18 | Disposition: A | Discharge status: Home / Self Care | Payer: 59 | Source: Ambulatory Visit | Attending: "Endocrinology | Admitting: "Endocrinology

## 2019-11-18 DIAGNOSIS — C73 Malignant neoplasm of thyroid gland: Secondary | ICD-10-CM

## 2019-11-18 MED ORDER — STERILE WATER FOR INJECTION IJ SOLN
INTRAMUSCULAR | Status: AC
  Filled 2019-11-18: qty 10

## 2019-11-18 MED ORDER — THYROTROPIN ALFA 1.1 MG IM SOLR: 0.9000 mg | INTRAMUSCULAR | Status: AC

## 2019-11-18 MED ORDER — THYROTROPIN ALFA 1.1 MG IM SOLR
INTRAMUSCULAR | Status: AC
  Administered 2019-11-18: 0.9 mg via INTRAMUSCULAR
  Filled 2019-11-18: qty 0.9

## 2019-11-19 ENCOUNTER — Encounter (HOSPITAL_COMMUNITY)
Admission: RE | Admit: 2019-11-19 | Discharge: 2019-11-19 | Disposition: A | Discharge status: Home / Self Care | Payer: 59 | Source: Ambulatory Visit | Attending: "Endocrinology | Admitting: "Endocrinology

## 2019-11-19 ENCOUNTER — Other Ambulatory Visit: Payer: Self-pay

## 2019-11-19 ENCOUNTER — Other Ambulatory Visit (HOSPITAL_COMMUNITY)
Admission: RE | Admit: 2019-11-19 | Discharge: 2019-11-19 | Disposition: A | Discharge status: Home / Self Care | Payer: 59 | Source: Ambulatory Visit | Attending: "Endocrinology | Admitting: "Endocrinology

## 2019-11-19 ENCOUNTER — Encounter (HOSPITAL_COMMUNITY): Payer: Self-pay

## 2019-11-19 DIAGNOSIS — C73 Malignant neoplasm of thyroid gland: Secondary | ICD-10-CM | POA: Diagnosis not present

## 2019-11-19 LAB — TSH: TSH: 117.553 u[IU]/mL — ABNORMAL HIGH (ref 0.350–4.500)

## 2019-11-19 LAB — T4, FREE: Free T4: 1.21 ng/dL — ABNORMAL HIGH (ref 0.61–1.12)

## 2019-11-19 MED ORDER — SODIUM IODIDE I 131 CAPSULE
50.0000 | Freq: Once | INTRAVENOUS | Status: AC | PRN
  Administered 2019-11-19: 50 via ORAL

## 2019-11-21 LAB — THYROGLOBULIN ANTIBODY: Thyroglobulin Antibody: <1 IU/mL (ref 0.0–0.9)

## 2019-11-22 LAB — COMPREHENSIVE THYROGLOBULIN
Anti-Thyroglobulin Antibodies: <1 IU/mL
Thyroglobulin (ICMA): 4.5 ng/mL

## 2019-11-29 ENCOUNTER — Encounter (HOSPITAL_COMMUNITY)
Admission: RE | Admit: 2019-11-29 | Discharge: 2019-11-29 | Disposition: A | Discharge status: Home / Self Care | Payer: 59 | Source: Ambulatory Visit | Attending: "Endocrinology | Admitting: "Endocrinology

## 2019-11-29 ENCOUNTER — Other Ambulatory Visit: Payer: Self-pay

## 2019-11-29 ENCOUNTER — Other Ambulatory Visit: Payer: Self-pay | Admitting: Family Medicine

## 2019-11-29 DIAGNOSIS — F320 Major depressive disorder, single episode, mild: Secondary | ICD-10-CM

## 2019-11-29 DIAGNOSIS — C73 Malignant neoplasm of thyroid gland: Secondary | ICD-10-CM | POA: Insufficient documentation

## 2019-11-29 DIAGNOSIS — F411 Generalized anxiety disorder: Secondary | ICD-10-CM

## 2019-12-02 ENCOUNTER — Telehealth (INDEPENDENT_AMBULATORY_CARE_PROVIDER_SITE_OTHER): Payer: 59 | Admitting: "Endocrinology

## 2019-12-02 ENCOUNTER — Encounter: Payer: Self-pay | Admitting: "Endocrinology

## 2019-12-02 DIAGNOSIS — C73 Malignant neoplasm of thyroid gland: Secondary | ICD-10-CM | POA: Diagnosis not present

## 2019-12-02 DIAGNOSIS — E890 Postprocedural hypothyroidism: Secondary | ICD-10-CM

## 2019-12-02 NOTE — Progress Notes (Signed)
12/02/2019, 2:51 PM                                Endocrinology Telehealth Visit Follow up Note -During COVID -19 Pandemic  This visit type was conducted  via telephone due to national recommendations for restrictions regarding the COVID-19 Pandemic  in an effort to limit this patient's exposure and mitigate transmission of the corona virus.   I connected with [name] on 12/02/2019   by telephone and verified that I am speaking with the correct person using two identifiers. Brenda [name], May 14, 1980. she has verbally consented to this visit.  I was in my office and patient was in her residence. No other persons were with me during the encounter. All issues noted in this document were discussed and addressed. The format was not optimal for physical exam.    Subjective:    Patient ID: Brenda [name], female    DOB: [birth year], PCP Janora Norlander, DO   Past Medical History:  Diagnosis Date  . Anxiety    Past Surgical History:  Procedure Laterality Date  . APPENDECTOMY    . THYROIDECTOMY Left 09/14/2019   Procedure: LEFT HEMITHYROIDECTOMY;  Surgeon: Leta Baptist, MD;  Location: [facility];  Service: ENT;  Laterality: Left;   Social History   Socioeconomic History  . Marital status: Married    Spouse name: [place]  . Number of children: 2  . Years of education: some college  . 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 Smoking status: Never Smoker  . Smokeless tobacco: Never Used  Vaping Use  . Vaping Use: Never used  Substance and Sexual Activity  . Alcohol use: Never  . Drug use: Yes    Types: Marijuana  . Sexual activity: Yes    Birth control/protection: Surgical    Comment: tubal  Other Topics Concern  . Not on file  Social History Narrative  . Not on file   Social Determinants of Health   Financial Resource Strain:   . Difficulty of  Paying Living Expenses: Not on file  Food Insecurity:   . Worried About Charity fundraiser in the Last Year: Not on file  . Ran Out of Food in the Last Year: Not on file  Transportation Needs:   . Lack of Transportation (Medical): Not on file  . Lack of Transportation (Non-Medical): Not on file  Physical Activity:   . Days of Exercise per Week: Not on file  . Minutes of Exercise per Session: Not on file  Stress:   . Feeling of Stress : Not on file  Social Connections:   . Frequency of Communication with Friends and Family: Not on file  . Frequency of Social Gatherings with Friends and Family: Not on file  . Attends Religious Services: Not on file  . Active Member of Clubs or Organizations: Not on file  . Attends Archivist Meetings: Not on file  . Marital Status: Not on file   Family History  Problem Relation Age of Onset  . Anxiety disorder Mother   .  Asthma Mother   . COPD Mother   . Hypertension Mother   . Hyperlipidemia Mother   . Osteoporosis Mother   . Diabetes Mother   . Heart disease Father   . Heart attack Father   . Bipolar disorder Sister   . Schizophrenia Sister   . Drug abuse Sister   . Hyperlipidemia Maternal Grandmother   . Hypertension Maternal Grandmother   . Stroke Maternal Grandmother   . Alzheimer's disease Maternal Grandmother   . Cancer Maternal Grandfather   . Heart disease Maternal Grandfather   . Kidney disease Paternal Grandmother   . Cancer Paternal Grandfather   . Drug abuse Brother   . Anxiety disorder Brother   . Depression Brother   . Thyroid disease Brother   . Asthma Son    Outpatient Encounter Medications as of 12/02/2019  Medication Sig  . escitalopram (LEXAPRO) 10 MG tablet TAKE ONE (1) TABLET EACH DAY  . levothyroxine (SYNTHROID) 100 MCG tablet Take 1 tablet by mouth daily.   No facility-administered encounter medications on file as of 12/02/2019.   ALLERGIES: No Known Allergies  VACCINATION STATUS:  There is no  immunization history on file for this patient.  HPI Brenda [name] is 39 y.o. female who presents today with a medical history as above. she is being engaged in telehealth via telephone after she was seen in consultation for papillary thyroid cancer and postsurgical hypothyroidism requested by Janora Norlander, DO.   -She was seen on consult in July 2021 when she was still recovering from her recent surgery.  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 A sample was sent for Afirma molecular study which was significant for  > 99% risk of malignancy reported on Aug 03, 2019. 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 Patient has done very well following her surgery, required calcium supplement briefly. She remains on levothyroxine 100 mcg p.o. daily.  She reports compliance and consistency with her medication.   She underwent Thyrogen stimulated I-131 thyroid remnant ablation on November 19, 2019.  Whole-body scan result is pending. She denies any family history of thyroid malignancy, however some thyroid dysfunction in a half brother. She denies cigarette smoking, however marijuana smoker. She denies dysphagia, shortness of breath, voice change. She denies palpitations, tremors, nor heat/cold intolerance. She reports steady body weight.  Review of Systems She has steady weight.  Otherwise limited as above.  Objective:    Vitals with BMI 09/30/2019 09/22/2019 09/22/2019  Height _0  - -  Weight 137 lbs - -  BMI 74.0 - -  Systolic 814 481 856  Diastolic 95 88 78  Pulse 80 64 65    There were no vitals taken for this visit.  Wt Readings from Last 3 Encounters:  09/30/19 137 lb (62.1 kg)  09/21/19 130 lb (59 kg)  09/14/19 137  lb 12.6 oz (62.5 kg)    Physical Exam   CMP ( most recent) CMP     Component Value Date/Time   NA 143 07/02/2019 1058   K 4.5 07/02/2019 1058   CL 100 07/02/2019 1058   CO2 23 07/02/2019 1058   GLUCOSE 85 07/02/2019 1058   BUN 8 07/02/2019 1058   CREATININE 0.73 07/02/2019 1058  CALCIUM 9.6 10/07/2019 0948   PROT 7.3 07/02/2019 1058   ALBUMIN 4.5 07/02/2019 1058   AST 20 07/02/2019 1058   ALT 12 07/02/2019 1058   ALKPHOS 61 07/02/2019 1058   BILITOT 0.2 07/02/2019 1058   GFRNONAA 106 07/02/2019 1058   GFRAA 122 07/02/2019 1058     Lipid Panel ( most recent) Lipid Panel     Component Value Date/Time   CHOL 157 07/02/2019 1058   TRIG 73 07/02/2019 1058   HDL 42 07/02/2019 1058   CHOLHDL 3.7 07/02/2019 1058   LDLCALC 101 (H) 07/02/2019 1058   LABVLDL 14 07/02/2019 1058      Lab Results  Component Value Date   TSH 117.553 (H) 11/19/2019   TSH 3.940 10/07/2019   TSH 5.030 (H) 09/28/2019   TSH 1.960 07/02/2019   FREET4 1.21 (H) 11/19/2019   FREET4 1.72 10/07/2019   FREET4 1.06 09/28/2019      Assessment & Plan:   1. Postsurgical hypothyroidism 2. Malignant neoplasm of thyroid gland ([place]) - Sadeen Baptista  is being seen at a kind request of Ronnie Doss M, DO. - I have reviewed her available thyroid records and clinically evaluated the patient. 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   Pathologic Stage Classification (pTNM, AJCC 8th Edition): pT2, pN not  assigned (no nodes submitted or found)   -In light of her follicular variant PTC, stage 2 cancer, relative youth of the patient, she was offered  adjuvant therapy with I-131 thyroid remnant ablation. This treatment was administered on November 19, 2019.  Post therapy whole-body scan results are pending.  She will be  contacted if whole-body scan results are concerning.    She  is recovering well, while continuing on thyroid hormone replacement with 100 mcg of levothyroxine.  Her previsit thyroid function tests are consistent with appropriately suppressive therapy.  She is advised to continue levothyroxine 100 mcg p.o. daily before breakfast.   - We discussed about the correct intake of her thyroid hormone, on empty stomach at fasting, with water, separated by at least 30 minutes from breakfast and other medications,  and separated by more than 4 hours from calcium, iron, multivitamins, acid reflux medications (PPIs). -Patient is made aware of the fact that thyroid hormone replacement is needed for life, dose to be adjusted by periodic monitoring of thyroid function tests.  She is advised to resume OsCal 500/200 mg unit tablets once a day.  - I did not initiate any new prescriptions today. - she is advised to maintain close follow up with Janora Norlander, DO for primary care needs.      - Time spent on this patient care encounter:  20 minutes of which 50% was spent in  counseling and the rest reviewing  her current and  previous labs / studies and medications  doses and developing a plan for long term care. Shirin Vantuyl  participated in the discussions, expressed understanding, and voiced agreement with the above plans.  All questions were answered to her satisfaction. she is encouraged to contact clinic should she have any questions or concerns prior to her return visit.   Follow up plan: Return in about 6 weeks (around 01/13/2020) for F/U with Pre-visit Labs.   Glade Lloyd, MD [place]  [facility] [address] [place], [place] [address] Phone: [phone]  Fax: [phone]     12/02/2019, 2:51 PM  This note was partially dictated with voice recognition software. Similar sounding words can be transcribed inadequately or may not  be corrected upon  review.

## 2019-12-28 ENCOUNTER — Other Ambulatory Visit: Payer: Self-pay | Admitting: Family Medicine

## 2019-12-29 ENCOUNTER — Other Ambulatory Visit: Payer: Self-pay | Admitting: Family Medicine

## 2019-12-29 MED ORDER — LEVOTHYROXINE SODIUM 100 MCG PO TABS
100.0000 ug | ORAL_TABLET | Freq: Every day | ORAL | 0 refills | Status: DC
Start: 2019-12-29 — End: 2020-01-13

## 2020-01-06 ENCOUNTER — Other Ambulatory Visit: Payer: 59

## 2020-01-06 ENCOUNTER — Other Ambulatory Visit: Payer: Self-pay

## 2020-01-07 LAB — TSH: TSH: 1.41 u[IU]/mL (ref 0.450–4.500)

## 2020-01-07 LAB — T4, FREE: Free T4: 1.51 ng/dL (ref 0.82–1.77)

## 2020-01-13 ENCOUNTER — Encounter: Payer: Self-pay | Admitting: "Endocrinology

## 2020-01-13 ENCOUNTER — Other Ambulatory Visit: Payer: Self-pay

## 2020-01-13 ENCOUNTER — Ambulatory Visit (INDEPENDENT_AMBULATORY_CARE_PROVIDER_SITE_OTHER): Payer: 59 | Admitting: "Endocrinology

## 2020-01-13 VITALS — BP 110/62 | HR 72 | Ht 65.0 in | Wt 144.8 lb

## 2020-01-13 DIAGNOSIS — C73 Malignant neoplasm of thyroid gland: Secondary | ICD-10-CM

## 2020-01-13 DIAGNOSIS — E890 Postprocedural hypothyroidism: Secondary | ICD-10-CM | POA: Diagnosis not present

## 2020-01-13 MED ORDER — LEVOTHYROXINE SODIUM 100 MCG PO TABS
100.0000 ug | ORAL_TABLET | Freq: Every day | ORAL | 1 refills | Status: DC
Start: 2020-01-13 — End: 2020-07-03

## 2020-01-13 NOTE — Progress Notes (Signed)
01/13/2020, 5:51 PM      Endocrinology follow-up note   Subjective:    Patient ID: Brenda [name], female    DOB: June 20, 1980, PCP Janora Norlander, DO   Past Medical History:  Diagnosis Date  . Anxiety    Past Surgical History:  Procedure Laterality Date  . APPENDECTOMY    . THYROIDECTOMY Left 09/14/2019   Procedure: LEFT HEMITHYROIDECTOMY;  Surgeon: Leta Baptist, MD;  Location: [facility];  Service: ENT;  Laterality: Left;   Social History   Socioeconomic History  . Marital status: Married    Spouse name: [place]  . Number of children: 2  . Years of education: some college  . 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 Smoking status: Never Smoker  . Smokeless tobacco: Never Used  Vaping Use  . Vaping Use: Never used  Substance and Sexual Activity  . Alcohol use: Never  . Drug use: Yes    Types: Marijuana  . Sexual activity: Yes    Birth control/protection: Surgical    Comment: tubal  Other Topics Concern  . Not on file  Social History Narrative  . Not on file   Social Determinants of Health   Financial Resource Strain:   . Difficulty of Paying Living Expenses: Not on file  Food Insecurity:   . Worried About Charity fundraiser in the Last Year: Not on file  . Ran Out of Food in the Last Year: Not on file  Transportation Needs:   . Lack of Transportation (Medical): Not on file  . Lack of Transportation (Non-Medical): Not on file  Physical Activity:   . Days of Exercise per Week: Not on file  . Minutes of Exercise per Session: Not on file  Stress:   . Feeling of Stress : Not on file  Social Connections:   . Frequency of Communication with Friends and Family: Not on file  . Frequency of Social Gatherings with Friends and Family: Not on file  . Attends Religious Services: Not on file  . Active Member of Clubs or Organizations: Not  on file  . Attends Archivist Meetings: Not on file  . Marital Status: Not on file   Family History  Problem Relation Age of Onset  . Anxiety disorder Mother   . Asthma Mother   . COPD Mother   . Hypertension Mother   . Hyperlipidemia Mother   . Osteoporosis Mother   . Diabetes Mother   . Heart disease Father   . Heart attack Father   . Bipolar disorder Sister   . Schizophrenia Sister   . Drug abuse Sister   . Hyperlipidemia Maternal Grandmother   . Hypertension Maternal Grandmother   . Stroke Maternal Grandmother   . Alzheimer's disease Maternal Grandmother   . Cancer Maternal Grandfather   . Heart disease Maternal Grandfather   . Kidney disease Paternal Grandmother   . Cancer Paternal Grandfather   . Drug abuse Brother   . Anxiety disorder Brother   . Depression Brother   . Thyroid disease Brother   . Asthma Son    Outpatient Encounter Medications as  of 01/13/2020  Medication Sig  . escitalopram (LEXAPRO) 10 MG tablet TAKE ONE (1) TABLET EACH DAY  . levothyroxine (SYNTHROID) 100 MCG tablet Take 1 tablet (100 mcg total) by mouth daily.  . [DISCONTINUED] levothyroxine (SYNTHROID) 100 MCG tablet Take 1 tablet (100 mcg total) by mouth daily.   No facility-administered encounter medications on file as of 01/13/2020.   ALLERGIES: No Known Allergies  VACCINATION STATUS:  There is no immunization history on file for this patient.  HPI Brenda [name] is 39 y.o. 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  She underwent total thyroidectomy for thyroid cancer.  She is status post Thyrogen stimulated I-131 remnant ablation with post therapy whole-body scan.  See below.  -PMD:  Janora Norlander, DO.    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 A sample was sent for Afirma molecular study which was significant for  > 99% risk of malignancy  reported on Aug 03, 2019. 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 -She underwent I-131 thyroid remnant ablation on November 19, 2019 with whole-body scan on November 29, 2019 revealing significant uptake in the neck, inconclusive for distant metastasis.  Patient has done very well following her surgery, required calcium supplement briefly. She remains on levothyroxine 100 mcg p.o. daily before breakfast.  She reports compliance and consistency to his medication.  Her previsit labs are consistent with appropriate replacement.    She denies any family history of thyroid malignancy, however some thyroid dysfunction in a half brother. She denies cigarette smoking, however marijuana smoker. She denies dysphagia, shortness of breath, voice change. She denies palpitations, tremors, nor heat/cold intolerance. She reports steady body weight.  Review of Systems She has steady weight.  Otherwise limited as above.  Objective:    Vitals with BMI 01/13/2020 09/30/2019 09/22/2019  Height _0  _1  -  Weight 144 lbs 13 oz 137 lbs -  BMI 22.0 25.4 -  Systolic 270 623 762  Diastolic 62 95 88  Pulse 72 80 64    BP 110/62   Pulse 72   Ht _2  (1.651 m)   Wt 144 lb 12.8 oz (65.7 kg)   BMI 24.10 kg/m   Wt Readings from Last 3 Encounters:  01/13/20 144 lb 12.8 oz (65.7 kg)  09/30/19 137 lb (62.1 kg)  09/21/19 130 lb (59 kg)    Physical Exam   CMP ( most recent) CMP     Component Value Date/Time   NA 143 07/02/2019 1058   K 4.5 07/02/2019 1058   CL 100 07/02/2019 1058   CO2 23 07/02/2019 1058   GLUCOSE 85 07/02/2019 1058   BUN 8 07/02/2019 1058   CREATININE 0.73 07/02/2019 1058   CALCIUM 9.6 10/07/2019 0948   PROT 7.3 07/02/2019 1058   ALBUMIN 4.5 07/02/2019 1058   AST 20 07/02/2019 1058   ALT 12 07/02/2019  1058   ALKPHOS 61 07/02/2019 1058   BILITOT 0.2 07/02/2019 1058   GFRNONAA 106 07/02/2019 1058   GFRAA 122 07/02/2019 1058     Lipid Panel ( most recent) Lipid Panel     Component Value Date/Time   CHOL 157 07/02/2019 1058   TRIG 73 07/02/2019 1058   HDL 42  07/02/2019 1058   CHOLHDL 3.7 07/02/2019 1058   LDLCALC 101 (H) 07/02/2019 1058   LABVLDL 14 07/02/2019 1058      Lab Results  Component Value Date   TSH 1.410 01/06/2020   TSH 117.553 (H) 11/19/2019   TSH 3.940 10/07/2019   TSH 5.030 (H) 09/28/2019   TSH 1.960 07/02/2019   FREET4 1.51 01/06/2020   FREET4 1.21 (H) 11/19/2019   FREET4 1.72 10/07/2019   FREET4 1.06 09/28/2019      Assessment & Plan:   1. Postsurgical hypothyroidism 2. Malignant neoplasm of thyroid gland ([place]) - Brenda [name]  is being seen at a kind request of Ronnie Doss M, DO. - I have reviewed her available thyroid records and clinically evaluated the patient. 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   Pathologic Stage Classification (pTNM, AJCC 8th Edition): pT2, pN not  assigned (no nodes submitted or found)   -In light of her follicular variant PTC, stage 2 cancer, relative youth of the patient, she was offered  adjuvant therapy with I-131 thyroid remnant ablation. This treatment was administered on November 19, 2019.  Post therapy whole-body scan results are consistent with significant uptake in the neck, inconclusive for distant metastases-nondiagnostic due to the degree of uptake of tracer at the neck, despite shielding.     -She will be considered for thyroid/neck ultrasound in 6 months.  She will also likely need another Thyrogen stimulated whole-body scan in a year.   She has recovered well from her surgery.  Her previsit thyroid function tests  are consistent with appropriate replacement.    she is advised to continue levothyroxine 100 mcg p.o. daily before breakfast.  - We discussed about the correct intake of her thyroid hormone, on empty stomach at fasting, with water, separated by at least 30 minutes from breakfast and other medications,  and separated by more than 4 hours from calcium, iron, multivitamins, acid reflux medications (PPIs). -Patient is made aware of the fact that thyroid hormone replacement is needed for life, dose to be adjusted by periodic monitoring of thyroid function tests.   She is advised to continue  OsCal 500/200 mg unit tablets once a day.   - she is advised to maintain close follow up with Janora Norlander, DO for primary care needs.      - Time spent on this patient care encounter:  30 minutes of which 50% was spent in  counseling and the rest reviewing  her current and  previous labs / studies and medications  doses and developing a plan for long term care. Brenda [name]  participated in the discussions, expressed understanding, and voiced agreement with the above plans.  All questions were answered to her satisfaction. she is encouraged to contact clinic should she have any questions or concerns prior to her return visit.   Follow up plan: Return in about 6 months (around 07/12/2020) for F/U with Pre-visit Labs, Thyroid / Neck Ultrasound.   Glade Lloyd, MD [facility] [address] [place], [place] [address] Phone: [phone]  Fax: [phone]     01/13/2020, 5:51 PM  This note was partially dictated with voice recognition software. Similar sounding words can be transcribed inadequately or may not  be corrected upon review.

## 2020-03-16 ENCOUNTER — Other Ambulatory Visit: Payer: Self-pay

## 2020-03-16 ENCOUNTER — Ambulatory Visit (INDEPENDENT_AMBULATORY_CARE_PROVIDER_SITE_OTHER): Payer: 59 | Admitting: Family Medicine

## 2020-03-16 ENCOUNTER — Telehealth: Payer: Self-pay

## 2020-03-16 ENCOUNTER — Encounter: Payer: Self-pay | Admitting: Family Medicine

## 2020-03-16 VITALS — Temp 98.4°F | Ht 65.0 in | Wt 145.0 lb

## 2020-03-16 DIAGNOSIS — R0600 Dyspnea, unspecified: Secondary | ICD-10-CM | POA: Diagnosis not present

## 2020-03-16 DIAGNOSIS — R0609 Other forms of dyspnea: Secondary | ICD-10-CM

## 2020-03-16 DIAGNOSIS — F320 Major depressive disorder, single episode, mild: Secondary | ICD-10-CM | POA: Diagnosis not present

## 2020-03-16 DIAGNOSIS — Z85850 Personal history of malignant neoplasm of thyroid: Secondary | ICD-10-CM | POA: Diagnosis not present

## 2020-03-16 DIAGNOSIS — F411 Generalized anxiety disorder: Secondary | ICD-10-CM | POA: Diagnosis not present

## 2020-03-16 DIAGNOSIS — R42 Dizziness and giddiness: Secondary | ICD-10-CM

## 2020-03-16 MED ORDER — PANTOPRAZOLE SODIUM 40 MG PO TBEC: 40.0000 mg | DELAYED_RELEASE_TABLET | Freq: Every day | ORAL | 3 refills | Status: DC

## 2020-03-16 MED ORDER — ALBUTEROL SULFATE HFA 108 (90 BASE) MCG/ACT IN AERS: 2.0000 | INHALATION_SPRAY | Freq: Four times a day (QID) | RESPIRATORY_TRACT | 0 refills | Status: DC | PRN

## 2020-03-16 MED ORDER — ESCITALOPRAM OXALATE 20 MG PO TABS
20.0000 mg | ORAL_TABLET | Freq: Every day | ORAL | 0 refills | Status: DC
Start: 2020-03-16 — End: 2021-07-16

## 2020-03-16 NOTE — Patient Instructions (Signed)
Claritin and Albuterol as we discussed x1 week  Then if NO improvement start Pantoprazole for reflux.

## 2020-03-16 NOTE — Telephone Encounter (Signed)
4 week appt made 04/13/20 for GAD reck

## 2020-03-16 NOTE — Progress Notes (Signed)
Subjective: CC: Lexapro refill PCP: Janora Norlander, DO GYF:VCBSWH Brenda [name] is a 40 y.o. female presenting to clinic today for:  1. GAD/ Depression Patient reports that she still has some intermittent, breakthrough anxiety and depression.  Though overall she does feel better on the Lexapro 10 mg daily.  No GI upset with this.  2. COPD? She was told by her specialist to inquire about potential diagnosis of COPD.  She notes that for over a year now she has had dyspnea on exertion.  She will become short of breath with activities like walking around the store or upstairs.  She tried an exercise regimen because she thought perhaps she was just out of shape but is not really able to go up more than 10 minutes without becoming short of breath.  No reports of wheeze.  She has a chronic cough that again has been present for over a year.  No hemoptysis.  She is a nontobacco user.  Though she does admit to smoking marijuana 2 times per week.  No reports of night sweats or unplanned weight loss.  She does admit to GERD which seems to be most prominent in the morning.  Not currently on any acid reflux medicines.  Not currently on any antihistamines.  Does not report any postnasal drainage, sneezing or watery eyes.  No lower extremity edema.  3. Dizziness Notes that she has had some postural dizziness that she noticed when she was swinging with her grandson.  Sometimes if she turns her head a certain way it occurs.  She describes this as room spinning and once it caused her to have nausea and vomiting.  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  She has not discussed this with her ENT but will be seeing him on Monday.  4. history of thyroid cancer Patient continues to follow-up with her endocrinologist regularly.  Next follow-up is in April.  Plan for neck ultrasound at that time.   ROS: Per HPI  No Known Allergies Past Medical History:  Diagnosis Date  . Anxiety     Current Outpatient  Medications:  .  escitalopram (LEXAPRO) 10 MG tablet, TAKE ONE (1) TABLET EACH DAY, Disp: 30 tablet, Rfl: 0 .  levothyroxine (SYNTHROID) 100 MCG tablet, Take 1 tablet (100 mcg total) by mouth daily., Disp: 95 tablet, Rfl: 1 Social History   Socioeconomic History  . Marital status: Married    Spouse name: [place]  . Number of children: 2  . Years of education: some college  . 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 Smoking status: Never Smoker  . Smokeless tobacco: Never Used  Vaping Use  . Vaping Use: Never used  Substance and Sexual Activity  . Alcohol use: Never  . Drug use: Yes    Types: Marijuana  . Sexual activity: Yes    Birth control/protection: Surgical    Comment: tubal  Other Topics Concern  . Not on file  Social History Narrative  . Not on file   Social Determinants of Health   Financial Resource Strain: Not on file  Food Insecurity: Not on file  Transportation Needs: Not on file  Physical Activity: Not on file  Stress: Not on file  Social Connections: Not on file  Intimate Partner Violence: Not on file   Family History  Problem Relation Age of Onset  . Anxiety disorder Mother   . Asthma Mother   .  COPD Mother   . Hypertension Mother   . Hyperlipidemia Mother   . Osteoporosis Mother   . Diabetes Mother   . Heart disease Father   . Heart attack Father   . Bipolar disorder Sister   . Schizophrenia Sister   . Drug abuse Sister   . Hyperlipidemia Maternal Grandmother   . Hypertension Maternal Grandmother   . Stroke Maternal Grandmother   . Alzheimer's disease Maternal Grandmother   . Cancer Maternal Grandfather   . Heart disease Maternal Grandfather   . Kidney disease Paternal Grandmother   . Cancer Paternal Grandfather   . Drug abuse Brother   . Anxiety disorder Brother   . Depression Brother   . Thyroid disease Brother   . Asthma Son     Objective: Office vital signs  reviewed. Temp 98.4 F (36.9 C) (Temporal)   Ht 5' 5"  (1.651 m)   Wt 145 lb (65.8 kg)   BMI 24.13 kg/m   Physical Examination:  General: Awake, alert, nontoxic, No acute distress HEENT: Normal; healing postsurgical scar noted at the base of the neck; left TM unremarkable. Cardio: regular rate and rhythm, S1S2 heard, no murmurs appreciated Pulm: clear to auscultation bilaterally, no wheezes, rhonchi or rales; normal work of breathing on room air GI: soft, non-tender, non-distended, bowel sounds present x4, no hepatomegaly, no splenomegaly, no masses Extremities: warm, well perfused, No edema, cyanosis or clubbing; +2 pulses bilaterally MSK: normal gait and station Neuro: No nystagmus  Orthostatic VS for the past 72 hrs (Last 3 readings):  Orthostatic BP Patient Position Orthostatic Pulse  03/16/20 1352 139/89 Supine 85  03/16/20 1351 (!) 144/95 Standing 107  03/16/20 1314 (!) 143/92 Sitting 101   Depression screen [facility] 2/9 03/16/2020 07/02/2019 05/26/2019  Decreased Interest 1 1 1   Down, Depressed, Hopeless 2 1 1   PHQ - 2 Score 3 2 2   Altered sleeping 1 2 2   Tired, decreased energy 2 1 2   Change in appetite 2 1 1   Feeling bad or failure about yourself  - 1 1  Trouble concentrating 1 2 1   Moving slowly or fidgety/restless 0 1 2  Suicidal thoughts 0 0 0  PHQ-9 Score 9 10 11   Difficult doing work/chores Somewhat difficult Somewhat difficult Very difficult   GAD 7 : Generalized Anxiety Score 03/16/2020 07/02/2019 05/26/2019  Nervous, Anxious, on Edge 1 2 2   Control/stop worrying 1 2 2   Worry too much - different things 2 2 2   Trouble relaxing 2 2 2   Restless 1 1 1   Easily annoyed or irritable 1 1 1   Afraid - awful might happen 2 2 2   Total GAD 7 Score 10 12 12   Anxiety Difficulty Somewhat difficult Somewhat difficult Very difficult   Assessment/ Plan: 40 y.o. female   GAD (generalized anxiety disorder)  Depression, major, single episode, mild (HCC)  Dyspnea on exertion -  Plan: DG Chest 2 View  History of malignant neoplasm of thyroid  Dizziness - Plan: CANCELED: CBC with Differential, CANCELED: CMP14+EGFR  Increase Lexapro to 20 mg daily.  Possible side effects discussed with patient  I wonder if her dyspnea on exertion is bronchospasm.  Her pulmonary exam was totally unremarkable today.  She is not a tobacco smoker so I think that COPD would be extremely unlikely in this patient.  I am going to challenge her with albuterol and antihistamines.  We discussed use of albuterol 15 minutes prior to onset of physical activity.  I would like her to continue this  regimen for the next week and if no apparent response, I am going to empirically trial her on a PPI given chronic cough and reports of GERD symptoms each morning  I like to see her back in 1 month.  We will plan to obtain a chest x-ray and/or EKG at that time pending response to current therapies.  She voiced good understanding of the plan and will follow up as directed   No orders of the defined types were placed in this encounter.  Meds ordered this encounter  Medications  . escitalopram (LEXAPRO) 20 MG tablet    Sig: Take 1 tablet (20 mg total) by mouth daily.    Dispense:  90 tablet    Refill:  0  . albuterol (VENTOLIN HFA) 108 (90 Base) MCG/ACT inhaler    Sig: Inhale 2 puffs into the lungs every 6 (six) hours as needed for wheezing or shortness of breath.    Dispense:  8 g    Refill:  0  . pantoprazole (PROTONIX) 40 MG tablet    Sig: Take 1 tablet (40 mg total) by mouth daily.    Dispense:  30 tablet    Refill:  [place], [place] [phone]

## 2020-04-13 ENCOUNTER — Other Ambulatory Visit: Payer: Self-pay

## 2020-04-13 ENCOUNTER — Ambulatory Visit (INDEPENDENT_AMBULATORY_CARE_PROVIDER_SITE_OTHER): Payer: 59

## 2020-04-13 ENCOUNTER — Encounter: Payer: Self-pay | Admitting: Family Medicine

## 2020-04-13 ENCOUNTER — Ambulatory Visit (INDEPENDENT_AMBULATORY_CARE_PROVIDER_SITE_OTHER): Payer: 59 | Admitting: Family Medicine

## 2020-04-13 VITALS — BP 134/87 | HR 92 | Temp 98.5°F | Ht 65.0 in | Wt 146.0 lb

## 2020-04-13 DIAGNOSIS — J45990 Exercise induced bronchospasm: Secondary | ICD-10-CM

## 2020-04-13 DIAGNOSIS — F411 Generalized anxiety disorder: Secondary | ICD-10-CM | POA: Diagnosis not present

## 2020-04-13 DIAGNOSIS — R0600 Dyspnea, unspecified: Secondary | ICD-10-CM

## 2020-04-13 DIAGNOSIS — F320 Major depressive disorder, single episode, mild: Secondary | ICD-10-CM

## 2020-04-13 DIAGNOSIS — R0609 Other forms of dyspnea: Secondary | ICD-10-CM

## 2020-04-13 MED ORDER — QVAR REDIHALER 40 MCG/ACT IN AERB: 2.0000 | INHALATION_SPRAY | Freq: Two times a day (BID) | RESPIRATORY_TRACT | 99 refills | Status: DC

## 2020-04-13 NOTE — Progress Notes (Signed)
Subjective: CC: GAD/ panic PCP: Janora Norlander, DO Brenda [name] is a 40 y.o. female presenting to clinic today for:  1. GAD/ panic Patient reports that anxiety has been stable.  She was unable to tolerate a full 20 mg of Lexapro and therefore she reduced back to 10 mg daily.  She feels satisfied with this dose for now.  2.  Dyspnea on exertion Patient notes the dyspnea on exertion has improved with use of the albuterol.  She is using it typically a couple of times per week and often before strenuous activity.  No swelling, chest pain.  No loss of consciousness.  Denies any heart palpitations.  She does report that her mouth is dry though after utilization    ROS: Per HPI  No Known Allergies Past Medical History:  Diagnosis Date  . Anxiety     Current Outpatient Medications:  .  albuterol (VENTOLIN HFA) 108 (90 Base) MCG/ACT inhaler, Inhale 2 puffs into the lungs every 6 (six) hours as needed for wheezing or shortness of breath., Disp: 8 g, Rfl: 0 .  escitalopram (LEXAPRO) 20 MG tablet, Take 1 tablet (20 mg total) by mouth daily., Disp: 90 tablet, Rfl: 0 .  levothyroxine (SYNTHROID) 100 MCG tablet, Take 1 tablet (100 mcg total) by mouth daily., Disp: 95 tablet, Rfl: 1 .  pantoprazole (PROTONIX) 40 MG tablet, Take 1 tablet (40 mg total) by mouth daily., Disp: 30 tablet, Rfl: 3 Social History   Socioeconomic History  . Marital status: Married    Spouse name: [place]  . Number of children: 2  . Years of education: some college  . 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 Smoking status: Never Smoker  . Smokeless tobacco: Never Used  Vaping Use  . Vaping Use: Never used  Substance and Sexual Activity  . Alcohol use: Never  . Drug use: Yes    Types: Marijuana  . Sexual activity: Yes    Birth control/protection: Surgical    Comment: tubal  Other Topics Concern  . Not on file  Social History Narrative   . Not on file   Social Determinants of Health   Financial Resource Strain: Not on file  Food Insecurity: Not on file  Transportation Needs: Not on file  Physical Activity: Not on file  Stress: Not on file  Social Connections: Not on file  Intimate Partner Violence: Not on file   Family History  Problem Relation Age of Onset  . Anxiety disorder Mother   . Asthma Mother   . COPD Mother   . Hypertension Mother   . Hyperlipidemia Mother   . Osteoporosis Mother   . Diabetes Mother   . Heart disease Father   . Heart attack Father   . Bipolar disorder Sister   . Schizophrenia Sister   . Drug abuse Sister   . Hyperlipidemia Maternal Grandmother   . Hypertension Maternal Grandmother   . Stroke Maternal Grandmother   . Alzheimer's disease Maternal Grandmother   . Cancer Maternal Grandfather   . Heart disease Maternal Grandfather   . Kidney disease Paternal Grandmother   . Cancer Paternal Grandfather   . Drug abuse Brother   . Anxiety disorder Brother   . Depression Brother   . Thyroid disease Brother   . Asthma Son     Objective: Office vital signs reviewed. BP 134/87   Pulse 92   Temp 98.5 F (  36.9 C) (Temporal)   Ht 5\' 5"  (1.651 m)   Wt 146 lb (66.2 kg)   SpO2 96%   BMI 24.30 kg/m   Physical Examination:  General: Awake, alert, well nourished, No acute distress HEENT: Normal, sclera white, MMM Pulm: clear to auscultation bilaterally, no wheezes, rhonchi or rales; normal work of breathing on room air MSK: Normal gait and station Psych: Mood stable  Assessment/ Plan: 40 y.o. female   GAD (generalized anxiety disorder)  Depression, major, single episode, mild (HCC)  Dyspnea on exertion - Plan: DG Chest 2 View, beclomethasone (QVAR REDIHALER) 40 MCG/ACT inhaler, CANCELED: EKG 12-Lead  Exercise induced bronchospasm - Plan: beclomethasone (QVAR REDIHALER) 40 MCG/ACT inhaler  Anxiety and depression are stable.    Chest x-ray was obtained and upon personal  review this was normal.  Qvar has been prescribed for maintenance at least during the winter months.  She understands to rinse her mouth out after each use.  May continue to use the albuterol if needed for exercise-induced bronchospasm should this arise.  No orders of the defined types were placed in this encounter.  No orders of the defined types were placed in this encounter.    Janora Norlander, DO [place] [phone]

## 2020-04-14 ENCOUNTER — Telehealth: Payer: Self-pay | Admitting: *Deleted

## 2020-04-14 NOTE — Telephone Encounter (Signed)
QVAR redihaler 40 Not covered by plan  These are alternatives they recommended:  Fluticasone-Salmeterol Tier 1 NOT Required* Budesonide-Formoterol Fumarate Tier 1 NOT Required* Budesonide Tier 1, QL NOT Required* Spiriva Respimat Tier 2, QL NOT Required* Flovent HFA Tier 2, QL NOT Required* Arnuity Ellipta Tier 2, QL NOT Required* Symbicort Tier 2, QL NOT Required* Flovent Diskus Tier 2, QL NOT Required* Breo Ellipta Tier 2, QL NOT Required

## 2020-04-17 MED ORDER — BREO ELLIPTA 100-25 MCG/INH IN AEPB: 1.0000 | INHALATION_SPRAY | Freq: Every day | RESPIRATORY_TRACT | 2 refills | Status: DC

## 2020-04-17 NOTE — Telephone Encounter (Signed)
Patient aware and verbalizes understanding. 

## 2020-04-17 NOTE — Telephone Encounter (Signed)
Please inform patient that [facility] has been sent to pharmacy since Qvar not covered

## 2020-05-04 ENCOUNTER — Ambulatory Visit (HOSPITAL_COMMUNITY): Payer: 59 | Admitting: Physical Therapy

## 2020-05-09 ENCOUNTER — Other Ambulatory Visit: Payer: Self-pay

## 2020-05-09 ENCOUNTER — Encounter (HOSPITAL_COMMUNITY): Payer: Self-pay | Admitting: Physical Therapy

## 2020-05-09 ENCOUNTER — Ambulatory Visit (HOSPITAL_COMMUNITY): Payer: 59 | Attending: Otolaryngology | Admitting: Physical Therapy

## 2020-05-09 DIAGNOSIS — R42 Dizziness and giddiness: Secondary | ICD-10-CM | POA: Diagnosis not present

## 2020-05-09 NOTE — Patient Instructions (Signed)
Eye Motion    Stand with hands out in front of head. Move eyes from one hand to the other as quickly as possible. Stop if you become dizzy or nauseous. Repeat __10__ times. Do __3Neck: Rotation    Sostenga el tronco y los hombros del [place]. Comience con la cabeza en el centro. El nio gira el mentn lentamente hacia el hombro derecho. No permita que el tronco o los hombros se [place]. El nio puede ayudar. Mantenga ____ segundos. Repita ____ veces. Haga ____ sesiones por da. CUIDADO: Los movimientos deben ser [place], constantes y lentos.  Copyright  VHI. All rights reserved.  __ sessions per day.  http://gt2.exer.us/530   Copyright  VHI. All rights reserved.

## 2020-05-09 NOTE — Therapy (Signed)
[place], [place], [address] Phone: [phone]   Fax:  [phone]  Physical Therapy Evaluation  Patient Details  Name: Brenda [name] MRN: [id number] Date of Birth: [birth year] Referring Provider (PT): Leta Baptist   Encounter Date: 05/09/2020   PT End of Session - 05/09/20 1209    Visit Number 1    Number of Visits 4    Date for PT Re-Evaluation 06/20/20    Authorization Type [employer]    Authorization - Number of Visits 2   pt has had one chiropractic session   Progress Note Due on Visit 30    PT Start Time 1130    PT Stop Time 1205    PT Time Calculation (min) 35 min    Activity Tolerance Patient tolerated treatment well           Past Medical History:  Diagnosis Date  . Anxiety     Past Surgical History:  Procedure Laterality Date  . APPENDECTOMY    . THYROIDECTOMY Left 09/14/2019   Procedure: LEFT HEMITHYROIDECTOMY;  Surgeon: Leta Baptist, MD;  Location: [facility];  Service: ENT;  Laterality: Left;    There were no vitals filed for this visit.    Subjective Assessment - 05/09/20 1128    Subjective Brenda [name] comes to the department referred for dizziness.  She states that she has been experiencing dizziness since Thanksgiving.  It started when she was going up steps and then she noticed it when she was swinging with her grandchild.  But it has improved significantly in the past two weeks.    Pertinent History \    Currently in Pain? No/denies              [facility] PT Assessment - 05/09/20 0001      Assessment   Medical Diagnosis dizziness    Referring Provider (PT) Su Teoh    Onset Date/Surgical Date 01/24/20    Next MD Visit not scheduled    Prior Therapy none      Precautions   Precautions None      Restrictions   Weight Bearing Restrictions No      Balance Screen   Has the patient fallen in the past 6 months No    Has the patient had a decrease in activity level  because of a fear of falling?  No    Is the patient reluctant to leave their home because of a fear of falling?  No      ROM / Strength   AROM / PROM / Strength AROM      AROM   AROM Assessment Site --   Cervical ROM wfl                 Vestibular Assessment - 05/09/20 0001      Symptom Behavior   Subjective history of current problem see above    Type of Dizziness  Imbalance    Frequency of Dizziness only 2x in the past 2 weeks    Duration of Dizziness 30" to a 60"    Symptom Nature Positional    Aggravating Factors Looking up to the ceiling    Relieving Factors Head stationary;Rest    Progression of Symptoms Better      Oculomotor Exam   Ocular ROM normal    Smooth Pursuits Intact      Vestibulo-Ocular Reflex   VOR 1 Head Only (x 1 viewing) moderate spinning  VOR 2 Head and Object (x 2 viewing) moderate      Positional Sensitivities   Supine to Left Side No dizziness    Supine to Right Side No dizziness    Supine to Sitting No dizziness    Right Hallpike No dizziness    Up from Right Hallpike No dizziness    Up from Left Hallpike No dizziness    Nose to Right Knee No dizziness    Nose to Left Knee Lightheadedness              Objective measurements completed on examination: See above findings.       [facility] Adult PT Treatment/Exercise - 05/09/20 0001      Exercises   Exercises Neck      Neck Exercises: Seated   Cervical Rotation Right;Left;5 reps    Other Seated Exercise eye motion to Lt and up x 5 each; VOR 2 x 5    Other Seated Exercise head to lt knee x 5                  PT Education - 05/09/20 1209    Education Details HEP    Person(s) Educated Patient    Methods Explanation;Verbal cues;Handout    Comprehension Verbalized understanding;Returned demonstration            PT Short Term Goals - 05/09/20 1215      PT SHORT TERM GOAL #1   Title Pt to be I in her HEP to alllow her to  have had no sx of dizziness for the past  2 weeks    Time 2    Period Weeks    Status New    Target Date 05/23/20             PT Long Term Goals - 05/09/20 1216      PT LONG TERM GOAL #1   Title PT to be I in HEP to decrease sx    Time 6    Period Weeks    Status New    Target Date 06/20/20      PT LONG TERM GOAL #2   Title PT to be able to swing without sx of dizziness    Time 6    Period Weeks    Status New      PT LONG TERM GOAL #3   Title PT to be able to look up without sx of dizziness    Time 6    Period Weeks    Status New                  Plan - 05/09/20 1210    Clinical Impression Statement Brenda [name] is a 40 yo female who has been referred to skilled PT for central vestibular dysfunction.  At this time she is doing better but still has dizziness with testing.  Her sx are mild but she iwill benefit from skilled PT to improve her sx to allow her to be comfortable playing with her grandchild.    Examination-Activity Limitations Other   playing, swinging with grandchild   Examination-Participation Restrictions Other   playing, swinging with grandchild   Stability/Clinical Decision Making Stable/Uncomplicated    Clinical Decision Making Low    Rehab Potential Good    PT Frequency Biweekly    PT Duration 3 weeks    PT Treatment/Interventions Manual techniques;Patient/family education;Therapeutic activities;Therapeutic exercise;Balance training    PT Next Visit Plan check single leg stance, give pt sitting nose  to LT knee for HEP as well as standing 1/4 and 1/2 turns, standing looking up and retrowalking ; single leg stance if needed    PT Home Exercise Plan cervical rotation, VOR 2, eye motion to the LT and looking up all 10 x 3 x a day           Patient will benefit from skilled therapeutic intervention in order to improve the following deficits and impairments:  Dizziness  Visit Diagnosis: Dizziness and giddiness     Problem List Patient Active Problem List   Diagnosis Date Noted  .  History of malignant neoplasm of thyroid 03/16/2020  . Malignant neoplasm of thyroid gland ([place]) 09/30/2019  . 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 S/P partial thyroidectomy 09/14/2019  . GAD (generalized anxiety disorder) 06/03/2019  . Milk allergy 06/03/2019  . Depression, major, single episode, mild [facility]) 06/03/2019   Rayetta Humphrey, PT CLT [phone] 05/09/2020, 12:18 PM  [place] [address] [place], [place], [address] Phone: [phone]   Fax:  [phone]  Name: Brenda [name] MRN: [id number] Date of Birth: 27-Sep-1980

## 2020-05-23 ENCOUNTER — Ambulatory Visit (HOSPITAL_COMMUNITY): Payer: 59 | Admitting: Physical Therapy

## 2020-06-06 ENCOUNTER — Telehealth (HOSPITAL_COMMUNITY): Payer: Self-pay | Admitting: Physical Therapy

## 2020-06-06 ENCOUNTER — Ambulatory Visit (HOSPITAL_COMMUNITY): Payer: 59 | Admitting: Physical Therapy

## 2020-06-06 NOTE — Telephone Encounter (Signed)
No Show #1. Called and left VM about missed appointment and reminded patient of next appointment.  3:29 PM, 06/06/20 Jerene Pitch, DPT Physical Therapy with [facility]  [phone] office

## 2020-06-19 ENCOUNTER — Telehealth (HOSPITAL_COMMUNITY): Payer: Self-pay | Admitting: Physical Therapy

## 2020-06-19 NOTE — Telephone Encounter (Signed)
pt cancelled appt for 06/20/20 because she forgot she had one

## 2020-06-20 ENCOUNTER — Ambulatory Visit (HOSPITAL_COMMUNITY): Payer: 59 | Admitting: Physical Therapy

## 2020-06-21 ENCOUNTER — Other Ambulatory Visit: Payer: 59

## 2020-06-21 ENCOUNTER — Ambulatory Visit (HOSPITAL_COMMUNITY)
Admission: RE | Admit: 2020-06-21 | Discharge: 2020-06-21 | Disposition: A | Discharge status: Home / Self Care | Payer: 59 | Source: Ambulatory Visit | Attending: "Endocrinology | Admitting: "Endocrinology

## 2020-06-21 ENCOUNTER — Other Ambulatory Visit: Payer: Self-pay

## 2020-06-21 DIAGNOSIS — C73 Malignant neoplasm of thyroid gland: Secondary | ICD-10-CM | POA: Diagnosis present

## 2020-06-28 LAB — T4, FREE: Free T4: 1.2 ng/dL (ref 0.82–1.77)

## 2020-06-28 LAB — THYROGLOBULIN LEVEL: Thyroglobulin (TG-RIA): <2 ng/mL

## 2020-06-28 LAB — THYROGLOBULIN ANTIBODY: Thyroglobulin Antibody: <1 IU/mL (ref 0.0–0.9)

## 2020-06-28 LAB — TSH: TSH: 2.63 u[IU]/mL (ref 0.450–4.500)

## 2020-06-30 ENCOUNTER — Other Ambulatory Visit: Payer: Self-pay | Admitting: "Endocrinology

## 2020-07-12 ENCOUNTER — Encounter: Payer: Self-pay | Admitting: "Endocrinology

## 2020-07-12 ENCOUNTER — Other Ambulatory Visit: Payer: Self-pay

## 2020-07-12 ENCOUNTER — Ambulatory Visit (INDEPENDENT_AMBULATORY_CARE_PROVIDER_SITE_OTHER): Payer: 59 | Admitting: "Endocrinology

## 2020-07-12 VITALS — BP 100/66 | HR 72 | Ht 65.0 in | Wt 148.0 lb

## 2020-07-12 DIAGNOSIS — C73 Malignant neoplasm of thyroid gland: Secondary | ICD-10-CM

## 2020-07-12 DIAGNOSIS — E890 Postprocedural hypothyroidism: Secondary | ICD-10-CM | POA: Diagnosis not present

## 2020-07-12 MED ORDER — LEVOTHYROXINE SODIUM 100 MCG PO TABS: ORAL_TABLET | ORAL | 1 refills | Status: DC

## 2020-07-12 NOTE — Progress Notes (Signed)
07/12/2020, 1:43 PM      Endocrinology follow-up note   Subjective:    Patient ID: Brenda [name], female    DOB: 05/27/1980, PCP Janora Norlander, DO   Past Medical History:  Diagnosis Date  . Anxiety    Past Surgical History:  Procedure Laterality Date  . APPENDECTOMY    . THYROIDECTOMY Left 09/14/2019   Procedure: LEFT HEMITHYROIDECTOMY;  Surgeon: Leta Baptist, MD;  Location: [facility];  Service: ENT;  Laterality: Left;   Social History   Socioeconomic History  . Marital status: Married    Spouse name: [place]  . Number of children: 2  . Years of education: some college  . 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 Smoking status: Never Smoker  . Smokeless tobacco: Never Used  Vaping Use  . Vaping Use: Never used  Substance and Sexual Activity  . Alcohol use: Never  . Drug use: Yes    Types: Marijuana  . Sexual activity: Yes    Birth control/protection: Surgical    Comment: tubal  Other Topics Concern  . Not on file  Social History Narrative  . Not on file   Social Determinants of Health   Financial Resource Strain: Not on file  Food Insecurity: Not on file  Transportation Needs: Not on file  Physical Activity: Not on file  Stress: Not on file  Social Connections: Not on file   Family History  Problem Relation Age of Onset  . Anxiety disorder Mother   . Asthma Mother   . COPD Mother   . Hypertension Mother   . Hyperlipidemia Mother   . Osteoporosis Mother   . Diabetes Mother   . Heart disease Father   . Heart attack Father   . Bipolar disorder Sister   . Schizophrenia Sister   . Drug abuse Sister   . Hyperlipidemia Maternal Grandmother   . Hypertension Maternal Grandmother   . Stroke Maternal Grandmother   . Alzheimer's disease Maternal Grandmother   . Cancer Maternal Grandfather   . Heart disease Maternal  Grandfather   . Kidney disease Paternal Grandmother   . Cancer Paternal Grandfather   . Drug abuse Brother   . Anxiety disorder Brother   . Depression Brother   . Thyroid disease Brother   . Asthma Son    Outpatient Encounter Medications as of 07/12/2020  Medication Sig  . albuterol (VENTOLIN HFA) 108 (90 Base) MCG/ACT inhaler Inhale 2 puffs into the lungs every 6 (six) hours as needed for wheezing or shortness of breath.  . escitalopram (LEXAPRO) 20 MG tablet Take 1 tablet (20 mg total) by mouth daily. (Patient taking differently: Take 0.5 tablets by mouth daily as needed.)  . fluticasone furoate-vilanterol (BREO ELLIPTA) 100-25 MCG/INH AEPB Inhale 1 puff into the lungs daily.  Marland Kitchen levothyroxine (SYNTHROID) 100 MCG tablet TAKE ONE (1) TABLET EACH DAY  . pantoprazole (PROTONIX) 40 MG tablet Take 1 tablet (40 mg total) by mouth daily.  . [DISCONTINUED] levothyroxine (SYNTHROID) 100 MCG tablet TAKE ONE (1) TABLET EACH DAY   No facility-administered encounter medications on file as of 07/12/2020.   ALLERGIES:  No Known Allergies  VACCINATION STATUS:  There is no immunization history on file for this patient.  HPI Brenda [name] is 40 y.o. 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  She underwent total thyroidectomy for thyroid cancer.  She is status post Thyrogen stimulated I-131 remnant ablation with post therapy whole-body scan.  See below.  -PMD:  Janora Norlander, DO.    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 A sample was sent for Afirma molecular study which was significant for  > 99% risk of malignancy reported on Aug 03, 2019. 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 -She  underwent I-131 thyroid remnant ablation on November 19, 2019 with whole-body scan on November 29, 2019 revealing significant uptake in the neck, inconclusive for distant metastasis.  Her interval surveillance thyroid/neck ultrasound was negative for any thyroid remnant or residual nor lymphadenopathy.  Patient has done very well following her surgery, required calcium supplement briefly. She remains on levothyroxine 100 mcg p.o. daily before breakfast.     She reports compliance and consistency to his medication.  Her previsit labs are consistent with appropriate replacement.    She denies any family history of thyroid malignancy, however some thyroid dysfunction in a half brother. She denies cigarette smoking, however marijuana smoker. She denies dysphagia, shortness of breath, voice change. She denies palpitations, tremors, nor heat/cold intolerance. She reports steady body weight.  Review of Systems She has steady weight.  Otherwise limited as above.  Objective:    Vitals with BMI 07/12/2020 04/13/2020 03/16/2020  Height 5' 5"  5' 5"  5' 5"   Weight 148 lbs 146 lbs 145 lbs  BMI 24.63 22.6 33.35  Systolic 456 256 -  Diastolic 66 87 -  Pulse 72 92 -    BP 100/66   Pulse 72   Ht 5' 5"  (1.651 m)   Wt 148 lb (67.1 kg)   BMI 24.63 kg/m   Wt Readings from Last 3 Encounters:  07/12/20 148 lb (67.1 kg)  04/13/20 146 lb (66.2 kg)  03/16/20 145 lb (65.8 kg)    Physical Exam   CMP ( most recent) CMP     Component Value Date/Time   NA 143 07/02/2019 1058   K 4.5 07/02/2019 1058   CL 100 07/02/2019 1058   CO2 23 07/02/2019 1058   GLUCOSE 85 07/02/2019 1058   BUN 8 07/02/2019 1058   CREATININE 0.73 07/02/2019 1058   CALCIUM 9.6 10/07/2019 0948   PROT 7.3 07/02/2019 1058   ALBUMIN 4.5 07/02/2019 1058   AST 20 07/02/2019 1058   ALT 12 07/02/2019 1058   ALKPHOS 61 07/02/2019 1058   BILITOT 0.2 07/02/2019 1058   GFRNONAA 106 07/02/2019 1058   GFRAA 122 07/02/2019 1058     Lipid  Panel ( most recent) Lipid Panel     Component Value Date/Time   CHOL 157 07/02/2019 1058   TRIG 73 07/02/2019 1058   HDL 42 07/02/2019 1058   CHOLHDL 3.7 07/02/2019 1058   LDLCALC 101 (H) 07/02/2019 1058   LABVLDL 14 07/02/2019 1058      Lab Results  Component Value Date   TSH 2.630 06/21/2020   TSH 1.410 01/06/2020   TSH 117.553 (H) 11/19/2019  TSH 3.940 10/07/2019   TSH 5.030 (H) 09/28/2019   TSH 1.960 07/02/2019   FREET4 1.20 06/21/2020   FREET4 1.51 01/06/2020   FREET4 1.21 (H) 11/19/2019   FREET4 1.72 10/07/2019   FREET4 1.06 09/28/2019      Assessment & Plan:   1. Postsurgical hypothyroidism 2. Malignant neoplasm of thyroid gland ([place]) - Teya D Toren  is being seen at a kind request of Gottschalk, Ashly M, DO. - I have reviewed her available thyroid records and clinically evaluated the patient. 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   Pathologic Stage Classification (pTNM, AJCC 8th Edition): pT2, pN not  assigned (no nodes submitted or found)   -In light of her follicular variant PTC, stage 2 cancer, relative youth of the patient, she was offered  adjuvant therapy with I-131 thyroid remnant ablation. This treatment was administered on November 19, 2019.  Post therapy whole-body scan results are consistent with significant uptake in the neck, inconclusive for distant metastases-nondiagnostic due to the degree of uptake of tracer at the neck, despite shielding.     -Her previsit thyroid/neck ultrasound was negative for any remnant or residual nor lymphadenopathy.    -  She will be considered for  another Thyrogen stimulated whole-body scan after a year.    She has recovered well from her surgery.  Her previsit thyroid function tests are consistent with appropriate  replacement.  She is advised to continue levothyroxine 100 mcg p.o. daily before breakfast.     - We discussed about the correct intake of her thyroid hormone, on empty stomach at fasting, with water, separated by at least 30 minutes from breakfast and other medications,  and separated by more than 4 hours from calcium, iron, multivitamins, acid reflux medications (PPIs). -Patient is made aware of the fact that thyroid hormone replacement is needed for life, dose to be adjusted by periodic monitoring of thyroid function tests.    She would benefit from continued supplement with Os-Cal 500/200 mg tablet once a day.     - she is advised to maintain close follow up with Janora Norlander, DO for primary care needs.     I spent 31 minutes in the care of the patient today including review of labs from Thyroid Function, CMP, and other relevant labs ; imaging/biopsy records (current and previous including abstractions from other facilities); face-to-face time discussing  her lab results and symptoms, medications doses, her options of short and long term treatment based on the latest standards of care / guidelines;   and documenting the encounter.  Anmarie D Sukup  participated in the discussions, expressed understanding, and voiced agreement with the above plans.  All questions were answered to her satisfaction. she is encouraged to contact clinic should she have any questions or concerns prior to her return visit.   Follow up plan: Return in about 6 months (around 01/12/2021) for F/U with Pre-visit Labs.   Glade Lloyd, MD [facility] [address] [place], [place] [address] Phone: [phone]  Fax: [phone]     07/12/2020, 1:43 PM  This note was partially dictated with voice recognition software. Similar sounding words can be transcribed inadequately or may not  be corrected upon review.

## 2020-08-05 ENCOUNTER — Other Ambulatory Visit: Payer: Self-pay | Admitting: Family Medicine

## 2020-08-05 ENCOUNTER — Encounter: Payer: Self-pay | Admitting: Family Medicine

## 2020-08-08 MED ORDER — ALBUTEROL SULFATE HFA 108 (90 BASE) MCG/ACT IN AERS: 2.0000 | INHALATION_SPRAY | Freq: Four times a day (QID) | RESPIRATORY_TRACT | 2 refills | Status: DC | PRN

## 2020-08-15 ENCOUNTER — Other Ambulatory Visit: Payer: Self-pay | Admitting: Family Medicine

## 2020-08-15 DIAGNOSIS — Z1231 Encounter for screening mammogram for malignant neoplasm of breast: Secondary | ICD-10-CM

## 2020-09-18 ENCOUNTER — Other Ambulatory Visit: Payer: Self-pay

## 2020-09-18 ENCOUNTER — Ambulatory Visit
Admission: RE | Admit: 2020-09-18 | Discharge: 2020-09-18 | Disposition: A | Discharge status: Home / Self Care | Payer: 59 | Source: Ambulatory Visit | Attending: Family Medicine | Admitting: Family Medicine

## 2020-09-18 DIAGNOSIS — Z1231 Encounter for screening mammogram for malignant neoplasm of breast: Secondary | ICD-10-CM

## 2020-09-22 ENCOUNTER — Other Ambulatory Visit: Payer: Self-pay | Admitting: Family Medicine

## 2020-09-22 DIAGNOSIS — R928 Other abnormal and inconclusive findings on diagnostic imaging of breast: Secondary | ICD-10-CM

## 2020-09-25 ENCOUNTER — Encounter: Payer: Self-pay | Admitting: Family Medicine

## 2020-09-25 ENCOUNTER — Ambulatory Visit (INDEPENDENT_AMBULATORY_CARE_PROVIDER_SITE_OTHER): Payer: 59 | Admitting: Family Medicine

## 2020-09-25 ENCOUNTER — Other Ambulatory Visit (HOSPITAL_COMMUNITY)
Admission: RE | Admit: 2020-09-25 | Discharge: 2020-09-25 | Disposition: A | Discharge status: Home / Self Care | Payer: 59 | Source: Ambulatory Visit | Attending: Family Medicine | Admitting: Family Medicine

## 2020-09-25 ENCOUNTER — Other Ambulatory Visit: Payer: Self-pay

## 2020-09-25 VITALS — BP 133/90 | HR 83 | Temp 97.4°F | Ht 65.0 in | Wt 146.4 lb

## 2020-09-25 DIAGNOSIS — Z85850 Personal history of malignant neoplasm of thyroid: Secondary | ICD-10-CM

## 2020-09-25 DIAGNOSIS — Z0001 Encounter for general adult medical examination with abnormal findings: Secondary | ICD-10-CM

## 2020-09-25 DIAGNOSIS — Z124 Encounter for screening for malignant neoplasm of cervix: Secondary | ICD-10-CM | POA: Diagnosis not present

## 2020-09-25 DIAGNOSIS — E890 Postprocedural hypothyroidism: Secondary | ICD-10-CM | POA: Diagnosis not present

## 2020-09-25 DIAGNOSIS — N888 Other specified noninflammatory disorders of cervix uteri: Secondary | ICD-10-CM | POA: Insufficient documentation

## 2020-09-25 DIAGNOSIS — Z0000 Encounter for general adult medical examination without abnormal findings: Secondary | ICD-10-CM

## 2020-09-25 DIAGNOSIS — K219 Gastro-esophageal reflux disease without esophagitis: Secondary | ICD-10-CM | POA: Diagnosis not present

## 2020-09-25 DIAGNOSIS — R5382 Chronic fatigue, unspecified: Secondary | ICD-10-CM | POA: Diagnosis not present

## 2020-09-25 DIAGNOSIS — R61 Generalized hyperhidrosis: Secondary | ICD-10-CM

## 2020-09-25 MED ORDER — OMEPRAZOLE 20 MG PO CPDR: 20.0000 mg | DELAYED_RELEASE_CAPSULE | Freq: Every day | ORAL | 3 refills | Status: DC

## 2020-09-25 NOTE — Patient Instructions (Signed)
Dr Carmela Rima (endocrine)  You had labs performed today.  You will be contacted with the results of the labs once they are available, usually in the next 3 business days for routine lab work.  If you have an active my chart account, they will be released to your MyChart.  If you prefer to have these labs released to you via telephone, please let us know.  If you had a pap smear or biopsy performed, expect to be contacted in about 7-10 days.  Preventive Care 93-16 Years Old, Female Preventive care refers to lifestyle choices and visits with your health care provider that can promote health and wellness. This includes: A yearly physical exam. This is also called an annual wellness visit. Regular dental and eye exams. Immunizations. Screening for certain conditions. Healthy lifestyle choices, such as: Eating a healthy diet. Getting regular exercise. Not using drugs or products that contain nicotine and tobacco. Limiting alcohol use. What can I expect for my preventive care visit? Physical exam Your health care provider may check your: Height and weight. These may be used to calculate your BMI (body mass index). BMI is a measurement that tells if you are at a healthy weight. Heart rate and blood pressure. Body temperature. Skin for abnormal spots. Counseling Your health care provider may ask you questions about your: Past medical problems. Family's medical history. Alcohol, tobacco, and drug use. Emotional well-being. Home life and relationship well-being. Sexual activity. Diet, exercise, and sleep habits. Work and work Statistician. Access to firearms. Method of birth control. Menstrual cycle. Pregnancy history. What immunizations do I need?  Vaccines are usually given at various ages, according to a schedule. Your health care provider will recommend vaccines for you based on your age, medicalhistory, and lifestyle or other factors, such as travel or where you work. What tests do I  need?  Blood tests Lipid and cholesterol levels. These may be checked every 5 years starting at age 67. Hepatitis C test. Hepatitis B test. Screening Diabetes screening. This is done by checking your blood sugar (glucose) after you have not eaten for a while (fasting). STD (sexually transmitted disease) testing, if you are at risk. BRCA-related cancer screening. This may be done if you have a family history of breast, ovarian, tubal, or peritoneal cancers. Pelvic exam and Pap test. This may be done every 3 years starting at age 60. Starting at age 38, this may be done every 5 years if you have a Pap test in combination with an HPV test. Talk with your health care provider about your test results, treatment options,and if necessary, the need for more tests. Follow these instructions at home: Eating and drinking  Eat a healthy diet that includes fresh fruits and vegetables, whole grains, lean protein, and low-fat dairy products. Take vitamin and mineral supplements as recommended by your health care provider. Do not drink alcohol if: Your health care provider tells you not to drink. You are pregnant, may be pregnant, or are planning to become pregnant. If you drink alcohol: Limit how much you have to 0-1 drink a day. Be aware of how much alcohol is in your drink. In the [place] one drink equals one 12 oz bottle of beer (355 mL), one 5 oz glass of wine (148 mL), or one 1 oz glass of hard liquor (44 mL).  Lifestyle Take daily care of your teeth and gums. Brush your teeth every morning and night with fluoride toothpaste. Floss one time each day. Stay active. Exercise for  at least 30 minutes 5 or more days each week. Do not use any products that contain nicotine or tobacco, such as cigarettes, e-cigarettes, and chewing tobacco. If you need help quitting, ask your health care provider. Do not use drugs. If you are sexually active, practice safe sex. Use a condom or other form of protection to  prevent STIs (sexually transmitted infections). If you do not wish to become pregnant, use a form of birth control. If you plan to become pregnant, see your health care provider for a prepregnancy visit. Find healthy ways to cope with stress, such as: Meditation, yoga, or listening to music. Journaling. Talking to a trusted person. Spending time with friends and family. Safety Always wear your seat belt while driving or riding in a vehicle. Do not drive: If you have been drinking alcohol. Do not ride with someone who has been drinking. When you are tired or distracted. While texting. Wear a helmet and other protective equipment during sports activities. If you have firearms in your house, make sure you follow all gun safety procedures. Seek help if you have been physically or sexually abused. What's next? Go to your health care provider once a year for an annual wellness visit. Ask your health care provider how often you should have your eyes and teeth checked. Stay up to date on all vaccines. This information is not intended to replace advice given to you by your health care provider. Make sure you discuss any questions you have with your healthcare provider. Document Revised: 10/24/2019 Document Reviewed: 11/06/2017 Elsevier Patient Education  2022 [employer].

## 2020-09-25 NOTE — Progress Notes (Signed)
Brenda [name] is a 40 y.o. female presents to office today for annual physical exam examination.    Concerns today include: 1.  Postsurgical hypothyroidism Patient with history of malignant neoplasm of the thyroid status post total thyroidectomy earlier this year.  She continues to follow-up closely with endocrinology for management and last levels were therapeutic.  No changes made to her Synthroid regimen.  She reports compliance with Synthroid.  She admits to night sweats and easy sweating when she goes out into the heat.  Sometimes has some dysphagia but overall she seems to be relatively well.  She is got her weight under a little bit better control.  Continues to experience fatigue.  2.  GERD Patient is compliant with Protonix.  This seems to help some of the abdominal issues that she was experiencing but now she has loose stools that often occur with meals.  No hematochezia or melena.  Marital status: married, Substance use: none. Diet: dairy free, Exercise: no structured Last mammogram: 09/2020, BIRADS0 Last pap smear: needs Refills needed today: none Immunizations needed:   There is no immunization history on file for this patient.  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  Tobacco Use   Smoking status: Never   Smokeless tobacco: Never  Vaping Use   Vaping Use: Never used  Substance and Sexual Activity   Alcohol use: Never   Drug use: Yes    Types: Marijuana   Sexual activity: Yes    Birth control/protection: Surgical    Comment: tubal  Other Topics Concern   Not on file  Social History Narrative   Not on file   Social Determinants of Health   Financial  Resource Strain: Not on file  Food Insecurity: Not on file  Transportation Needs: Not on file  Physical Activity: Not on file  Stress: Not on file  Social Connections: Not on file  Intimate Partner Violence: Not on file   Past Surgical History:  Procedure Laterality Date   APPENDECTOMY     THYROIDECTOMY Left 09/14/2019   Procedure: LEFT HEMITHYROIDECTOMY;  Surgeon: Leta Baptist, MD;  Location: [facility];  Service: ENT;  Laterality: Left;   Family History  Problem Relation Age of Onset   Anxiety disorder Mother    Asthma Mother    COPD Mother    Hypertension Mother    Hyperlipidemia Mother    Osteoporosis Mother    Diabetes Mother    Heart disease Father    Heart attack Father    Bipolar disorder Sister    Schizophrenia Sister    Drug abuse Sister    Hyperlipidemia Maternal Grandmother    Hypertension Maternal Grandmother    Stroke Maternal Grandmother    Alzheimer's disease Maternal Grandmother    Cancer Maternal Grandfather    Heart disease Maternal Grandfather    Kidney disease Paternal Grandmother    Cancer Paternal Grandfather    Drug abuse Brother    Anxiety disorder Brother    Depression Brother    Thyroid disease Brother    Asthma Son     Current Outpatient Medications:    albuterol (  VENTOLIN HFA) 108 (90 Base) MCG/ACT inhaler, Inhale 2 puffs into the lungs every 6 (six) hours as needed for wheezing or shortness of breath., Disp: 8 g, Rfl: 2   escitalopram (LEXAPRO) 20 MG tablet, Take 1 tablet (20 mg total) by mouth daily. (Patient taking differently: Take 0.5 tablets by mouth daily as needed.), Disp: 90 tablet, Rfl: 0   fluticasone furoate-vilanterol (BREO ELLIPTA) 100-25 MCG/INH AEPB, Inhale 1 puff into the lungs daily., Disp: 60 each, Rfl: 2   levothyroxine (SYNTHROID) 100 MCG tablet, TAKE ONE (1) TABLET EACH DAY, Disp: 90 tablet, Rfl: 1   pantoprazole (PROTONIX) 40 MG tablet, TAKE ONE (1) TABLET EACH DAY, Disp: 30 tablet, Rfl: 2  No Known  Allergies   ROS: Review of Systems Pertinent items noted in HPI and remainder of comprehensive ROS otherwise negative.    Physical exam BP 133/90   Pulse 83   Temp (!) 97.4 F (36.3 C)   Ht _0  (1.651 m)   Wt 146 lb 6.4 oz (66.4 kg)   LMP 09/12/2020   SpO2 100%   BMI 24.36 kg/m  General appearance: alert, cooperative, appears stated age, fatigued, and no distress Head: Normocephalic, without obvious abnormality, atraumatic Eyes: negative findings: lids and lashes normal, conjunctivae and sclerae normal, corneas clear, and pupils equal, round, reactive to light and accomodation Ears: normal TM's and external ear canals both ears Nose: Nares normal. Septum midline. Mucosa normal. No drainage or sinus tenderness. Throat: lips, mucosa, and tongue normal; teeth and gums normal Neck: no adenopathy, supple, symmetrical, trachea midline, and thyroid surgically absent with well-healed postsurgical scar noted along the anterior aspect of the neck Back: symmetric, no curvature. ROM normal. No CVA tenderness. Lungs: clear to auscultation bilaterally Heart: regular rate and rhythm, S1, S2 normal, no murmur, click, rub or gallop Abdomen: soft, non-tender; bowel sounds normal; no masses,  no organomegaly Pelvic: external genitalia normal, no adnexal masses or tenderness, no cervical motion tenderness, rectovaginal septum normal, uterus normal size, shape, and consistency, vagina normal without discharge, and cervix is anteverted but comes into view easily.  She has a nabothian cyst along the outer rim of the cervix at approximately the 7 o'clock position. Extremities: extremities normal, atraumatic, no cyanosis or edema Pulses: 2+ and symmetric Skin: Skin color, texture, turgor normal. No rashes or lesions Lymph nodes: Cervical, supraclavicular, and axillary nodes normal. Neurologic: Alert and oriented X 3, normal strength and tone. Normal symmetric reflexes. Normal coordination and gait Psych:  Mood is stable.  Patient is very pleasant and interactive Depression screen [facility] 2/9 09/25/2020 04/13/2020 03/16/2020  Decreased Interest 0 0 1  Down, Depressed, Hopeless 1 0 2  PHQ - 2 Score 1 0 3  Altered sleeping 1 0 1  Tired, decreased energy 1 0 2  Change in appetite 1 0 2  Feeling bad or failure about yourself  1 0 -  Trouble concentrating 0 0 1  Moving slowly or fidgety/restless 0 0 0  Suicidal thoughts 0 0 0  PHQ-9 Score 5 0 9  Difficult doing work/chores Somewhat difficult - Somewhat difficult   GAD 7 : Generalized Anxiety Score 09/25/2020 03/16/2020 07/02/2019 05/26/2019  Nervous, Anxious, on Edge _1 Control/stop worrying _2 Worry too much - different things _3 Trouble relaxing _4 Restless _5 Easily annoyed or irritable _6 Afraid - awful might happen 2 2 2  2  Total GAD 7 Score _0 Anxiety Difficulty Somewhat difficult Somewhat difficult Somewhat difficult Very difficult    Assessment/ Plan: Balinda D Hyser here for annual physical exam.   Annual physical exam  Screening for malignant neoplasm of cervix - Plan: Cytology - PAP  Single nabothian cyst  Postsurgical hypothyroidism - Plan: CMP14+EGFR, Lipid panel  History of malignant neoplasm of thyroid - Plan: CMP14+EGFR, Lipid panel, TSH, T4, free, CBC with Differential/Platelet  History of total thyroidectomy - Plan: TSH, T4, free, CBC with Differential/Platelet  Night sweat  Gastroesophageal reflux disease without esophagitis - Plan: omeprazole (PRILOSEC) 20 MG capsule  Chronic fatigue - Plan: Vitamin B12  Pap collected.  Declined COVID testing for STDs but HPV was added given age greater than 42 years old.  Cervix notable for single nabothian cyst at 7 o'clock position.  Exam was otherwise unremarkable  She continues to follow-up with endocrinology.  She will come in for fasting labs and we will collect TSH and free T4 during that appointment.  Experiencing both diarrhea and  chronic fatigue along with night sweats.  I have changed her PPI to omeprazole and hopefully this will resolve the diarrhea.  Check B12 level and thyroid levels as above along with CBC given chronic fatigability.  Uncertain as to where the night sweats are coming from but we will evaluate further.  Handout on healthy lifestyle choices, including diet (rich in fruits, vegetables and lean meats and low in salt and simple carbohydrates) and exercise (at least 30 minutes of moderate physical activity daily).   Devota Viruet M. Lajuana Ripple, DO

## 2020-09-27 LAB — CYTOLOGY - PAP
Adequacy: ABSENT
Comment: NEGATIVE
Diagnosis: NEGATIVE
High risk HPV: NEGATIVE

## 2020-10-09 ENCOUNTER — Ambulatory Visit
Admission: RE | Admit: 2020-10-09 | Discharge: 2020-10-09 | Disposition: A | Discharge status: Home / Self Care | Payer: 59 | Source: Ambulatory Visit | Attending: Family Medicine | Admitting: Family Medicine

## 2020-10-09 ENCOUNTER — Other Ambulatory Visit: Payer: Self-pay

## 2020-10-09 ENCOUNTER — Other Ambulatory Visit: Payer: 59

## 2020-10-09 ENCOUNTER — Other Ambulatory Visit: Payer: Self-pay | Admitting: Family Medicine

## 2020-10-09 DIAGNOSIS — R928 Other abnormal and inconclusive findings on diagnostic imaging of breast: Secondary | ICD-10-CM

## 2020-10-09 DIAGNOSIS — E890 Postprocedural hypothyroidism: Secondary | ICD-10-CM

## 2020-10-09 DIAGNOSIS — R5382 Chronic fatigue, unspecified: Secondary | ICD-10-CM

## 2020-10-09 DIAGNOSIS — Z85850 Personal history of malignant neoplasm of thyroid: Secondary | ICD-10-CM

## 2020-10-10 ENCOUNTER — Other Ambulatory Visit: Payer: Self-pay | Admitting: Family Medicine

## 2020-10-10 DIAGNOSIS — E890 Postprocedural hypothyroidism: Secondary | ICD-10-CM

## 2020-10-10 LAB — CMP14+EGFR
ALT: 15 IU/L (ref 0–32)
AST: 21 IU/L (ref 0–40)
Albumin/Globulin Ratio: 1.6 (ref 1.2–2.2)
Albumin: 4.6 g/dL (ref 3.8–4.8)
Alkaline Phosphatase: 76 IU/L (ref 44–121)
BUN/Creatinine Ratio: 11 (ref 9–23)
BUN: 9 mg/dL (ref 6–20)
Bilirubin Total: 0.4 mg/dL (ref 0.0–1.2)
CO2: 22 mmol/L (ref 20–29)
Calcium: 9.6 mg/dL (ref 8.7–10.2)
Chloride: 101 mmol/L (ref 96–106)
Creatinine, Ser: 0.81 mg/dL (ref 0.57–1.00)
Globulin, Total: 2.9 g/dL (ref 1.5–4.5)
Glucose: 91 mg/dL (ref 65–99)
Potassium: 4.9 mmol/L (ref 3.5–5.2)
Sodium: 139 mmol/L (ref 134–144)
Total Protein: 7.5 g/dL (ref 6.0–8.5)
eGFR: 95 mL/min/{1.73_m2} (ref >59)

## 2020-10-10 LAB — CBC WITH DIFFERENTIAL/PLATELET
Basophils Absolute: 0.1 10*3/uL (ref 0.0–0.2)
Basos: 1 %
EOS (ABSOLUTE): 0.4 10*3/uL (ref 0.0–0.4)
Eos: 5 %
Hematocrit: 42.9 % (ref 34.0–46.6)
Hemoglobin: 14.4 g/dL (ref 11.1–15.9)
Immature Grans (Abs): 0 10*3/uL (ref 0.0–0.1)
Immature Granulocytes: 0 %
Lymphocytes Absolute: 2.2 10*3/uL (ref 0.7–3.1)
Lymphs: 31 %
MCH: 30.6 pg (ref 26.6–33.0)
MCHC: 33.6 g/dL (ref 31.5–35.7)
MCV: 91 fL (ref 79–97)
Monocytes Absolute: 0.7 10*3/uL (ref 0.1–0.9)
Monocytes: 10 %
Neutrophils Absolute: 3.7 10*3/uL (ref 1.4–7.0)
Neutrophils: 53 %
Platelets: 278 10*3/uL (ref 150–450)
RBC: 4.7 x10E6/uL (ref 3.77–5.28)
RDW: 11.6 % — ABNORMAL LOW (ref 11.7–15.4)
WBC: 7 10*3/uL (ref 3.4–10.8)

## 2020-10-10 LAB — VITAMIN B12: Vitamin B-12: 475 pg/mL (ref 232–1245)

## 2020-10-10 LAB — LIPID PANEL
Chol/HDL Ratio: 4.6 ratio — ABNORMAL HIGH (ref 0.0–4.4)
Cholesterol, Total: 192 mg/dL (ref 100–199)
HDL: 42 mg/dL (ref >39)
LDL Chol Calc (NIH): 127 mg/dL — ABNORMAL HIGH (ref 0–99)
Triglycerides: 130 mg/dL (ref 0–149)
VLDL Cholesterol Cal: 23 mg/dL (ref 5–40)

## 2020-10-10 LAB — TSH: TSH: 3.29 u[IU]/mL (ref 0.450–4.500)

## 2020-10-10 LAB — T4, FREE: Free T4: 1.88 ng/dL — ABNORMAL HIGH (ref 0.82–1.77)

## 2020-10-10 MED ORDER — LEVOTHYROXINE SODIUM 100 MCG PO TABS: ORAL_TABLET | ORAL | 1 refills | Status: DC

## 2020-10-19 ENCOUNTER — Ambulatory Visit
Admission: RE | Admit: 2020-10-19 | Discharge: 2020-10-19 | Disposition: A | Discharge status: Home / Self Care | Payer: 59 | Source: Ambulatory Visit | Attending: Family Medicine | Admitting: Family Medicine

## 2020-10-19 ENCOUNTER — Other Ambulatory Visit: Payer: Self-pay

## 2020-10-19 ENCOUNTER — Other Ambulatory Visit: Payer: Self-pay | Admitting: Family Medicine

## 2020-10-19 DIAGNOSIS — R928 Other abnormal and inconclusive findings on diagnostic imaging of breast: Secondary | ICD-10-CM

## 2020-10-23 ENCOUNTER — Other Ambulatory Visit: Payer: Self-pay | Admitting: Family Medicine

## 2020-10-23 ENCOUNTER — Ambulatory Visit
Admission: RE | Admit: 2020-10-23 | Discharge: 2020-10-23 | Disposition: A | Discharge status: Home / Self Care | Payer: 59 | Source: Ambulatory Visit | Attending: Family Medicine | Admitting: Family Medicine

## 2020-10-23 ENCOUNTER — Other Ambulatory Visit: Payer: Self-pay

## 2020-10-23 DIAGNOSIS — R928 Other abnormal and inconclusive findings on diagnostic imaging of breast: Secondary | ICD-10-CM

## 2020-11-02 ENCOUNTER — Telehealth: Payer: Self-pay

## 2020-11-02 DIAGNOSIS — E890 Postprocedural hypothyroidism: Secondary | ICD-10-CM

## 2020-11-02 MED ORDER — LEVOTHYROXINE SODIUM 100 MCG PO TABS: 100.0000 ug | ORAL_TABLET | Freq: Every day | ORAL | 0 refills | Status: DC

## 2020-11-02 NOTE — Telephone Encounter (Signed)
Rx refill sent.

## 2020-11-02 NOTE — Telephone Encounter (Signed)
Pt requesting refill on her synthroid medication. Please send to [facility].

## 2020-11-03 ENCOUNTER — Telehealth: Payer: Self-pay | Admitting: Family Medicine

## 2020-11-03 NOTE — Telephone Encounter (Signed)
Ok to do this once.

## 2020-11-27 ENCOUNTER — Other Ambulatory Visit: Payer: Self-pay

## 2020-11-27 ENCOUNTER — Telehealth: Payer: Self-pay | Admitting: Family Medicine

## 2020-11-27 ENCOUNTER — Other Ambulatory Visit: Payer: 59

## 2020-11-27 DIAGNOSIS — E890 Postprocedural hypothyroidism: Secondary | ICD-10-CM

## 2020-11-27 NOTE — Telephone Encounter (Signed)
nuMber not in servie

## 2020-11-28 LAB — T4, FREE: Free T4: 1.48 ng/dL (ref 0.82–1.77)

## 2020-11-28 LAB — TSH: TSH: 11 u[IU]/mL — ABNORMAL HIGH (ref 0.450–4.500)

## 2020-11-29 ENCOUNTER — Other Ambulatory Visit: Payer: Self-pay | Admitting: "Endocrinology

## 2020-11-29 DIAGNOSIS — E890 Postprocedural hypothyroidism: Secondary | ICD-10-CM

## 2020-11-29 MED ORDER — LEVOTHYROXINE SODIUM 112 MCG PO TABS: 112.0000 ug | ORAL_TABLET | Freq: Every day | ORAL | 1 refills | Status: DC

## 2020-12-06 NOTE — Telephone Encounter (Signed)
Unable to reach patient, encounter closed °

## 2021-01-03 ENCOUNTER — Other Ambulatory Visit: Payer: Self-pay

## 2021-01-03 ENCOUNTER — Other Ambulatory Visit: Payer: 59

## 2021-01-04 LAB — TSH: TSH: 1.39 u[IU]/mL (ref 0.450–4.500)

## 2021-01-04 LAB — T4, FREE: Free T4: 1.33 ng/dL (ref 0.82–1.77)

## 2021-01-15 ENCOUNTER — Encounter: Payer: Self-pay | Admitting: "Endocrinology

## 2021-01-15 ENCOUNTER — Ambulatory Visit (INDEPENDENT_AMBULATORY_CARE_PROVIDER_SITE_OTHER): Payer: 59 | Admitting: "Endocrinology

## 2021-01-15 VITALS — BP 122/82 | HR 80 | Ht 65.0 in | Wt 149.8 lb

## 2021-01-15 DIAGNOSIS — E890 Postprocedural hypothyroidism: Secondary | ICD-10-CM | POA: Diagnosis not present

## 2021-01-15 DIAGNOSIS — C73 Malignant neoplasm of thyroid gland: Secondary | ICD-10-CM | POA: Diagnosis not present

## 2021-01-15 NOTE — Progress Notes (Signed)
07/12/2020, 1:43 PM      Endocrinology follow-up note   Subjective:    Patient ID: Brenda [name], female    DOB: 1980/12/31, PCP Janora Norlander, DO   Past Medical History:  Diagnosis Date   Anxiety    Past Surgical History:  Procedure Laterality Date   APPENDECTOMY     THYROIDECTOMY Left 09/14/2019   Procedure: LEFT HEMITHYROIDECTOMY;  Surgeon: Leta Baptist, MD;  Location: [place];  Service: ENT;  Laterality: Left;   Social History   Socioeconomic History   Marital status: Married    Spouse name: [place]   Number of children: 2   Years of education: some college   Highest education level: Not on file  Occupational History    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  Tobacco Use   Smoking status: Never Smoker   Smokeless tobacco: Never Used  Scientific laboratory technician Use: Never used  Substance and Sexual Activity   Alcohol use: Never   Drug use: Yes    Types: Marijuana   Sexual activity: Yes    Birth control/protection: Surgical    Comment: tubal  Other Topics Concern   Not on file  Social History Narrative   Not on file   Social Determinants of Health   Financial Resource Strain: Not on file  Food Insecurity: Not on file  Transportation Needs: Not on file  Physical Activity: Not on file  Stress: Not on file  Social Connections: Not on file   Family History  Problem Relation Age of Onset   Anxiety disorder Mother    Asthma Mother    COPD Mother    Hypertension Mother    Hyperlipidemia Mother    Osteoporosis Mother    Diabetes Mother    Heart disease Father    Heart attack Father    Bipolar disorder Sister    Schizophrenia Sister    Drug abuse Sister    Hyperlipidemia Maternal Grandmother    Hypertension Maternal Grandmother    Stroke Maternal Grandmother    Alzheimer's disease Maternal Grandmother    Cancer Maternal Grandfather    Heart disease Maternal Grandfather    Kidney disease Paternal  Grandmother    Cancer Paternal Grandfather    Drug abuse Brother    Anxiety disorder Brother    Depression Brother    Thyroid disease Brother    Asthma Son    Outpatient Encounter Medications as of 07/12/2020  Medication Sig   albuterol (VENTOLIN HFA) 108 (90 Base) MCG/ACT inhaler Inhale 2 puffs into the lungs every 6 (six) hours as needed for wheezing or shortness of breath.   escitalopram (LEXAPRO) 20 MG tablet Take 1 tablet (20 mg total) by mouth daily. (Patient taking differently: Take 0.5 tablets by mouth daily as needed.)   fluticasone furoate-vilanterol (BREO ELLIPTA) 100-25 MCG/INH AEPB Inhale 1 puff into the lungs daily.   levothyroxine (SYNTHROID) 100 MCG tablet TAKE ONE (1) TABLET EACH DAY   pantoprazole (PROTONIX) 40 MG tablet Take 1 tablet (40 mg total) by mouth daily.   [DISCONTINUED] levothyroxine (SYNTHROID) 100 MCG tablet TAKE ONE (1) TABLET EACH DAY   No facility-administered encounter medications on file as of 07/12/2020.   ALLERGIES:  No Known Allergies  VACCINATION STATUS:  There is no immunization history on file for this patient.  HPI Brenda [name] is 40 y.o. 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  She underwent total thyroidectomy for thyroid cancer.  She is status post Thyrogen stimulated I-131 remnant ablation with post therapy whole-body scan.  See below.  -PMD:  Janora Norlander, DO.    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 A sample was sent for Afirma molecular study which was significant for  > 99% risk of malignancy reported on Aug 03, 2019. 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 -She underwent I-131 thyroid remnant ablation on November 19, 2019 with whole-body scan on November 29, 2019 revealing significant uptake in the neck, inconclusive for distant metastasis.  Her surveillance thyroid/neck ultrasound in April 2022 was  negative for any thyroid remnant or residual nor lymphadenopathy.  Patient has done very well following her surgery, required calcium supplement briefly. She remains on levothyroxine 100 mcg p.o. daily before breakfast.     She reports compliance and consistency to his medication.  Her previsit labs are consistent with appropriate replacement.    She denies any family history of thyroid malignancy, however some thyroid dysfunction in a half brother. She denies cigarette smoking, however marijuana smoker. She denies dysphagia, shortness of breath, voice change. She denies palpitations, tremors, nor heat/cold intolerance. She reports steady body weight.  Review of Systems She has steady weight.  Otherwise limited as above.  Objective:    Vitals with BMI 07/12/2020 04/13/2020 03/16/2020  Height _0  _1  _2   Weight 148 lbs 146 lbs 145 lbs  BMI 24.63 27.7 82.42  Systolic 353 614 -  Diastolic 66 87 -  Pulse 72 92 -    BP 100/66   Pulse 72   Ht _3  (1.651 m)   Wt 148 lb (67.1 kg)   BMI 24.63 kg/m   Wt Readings from Last 3 Encounters:  07/12/20 148 lb (67.1 kg)  04/13/20 146 lb (66.2 kg)  03/16/20 145 lb (65.8 kg)    Physical Exam   CMP ( most recent) CMP     Component Value Date/Time   NA 143 07/02/2019 1058   K 4.5 07/02/2019 1058   CL 100 07/02/2019 1058   CO2 23 07/02/2019 1058   GLUCOSE 85 07/02/2019 1058   BUN 8 07/02/2019 1058   CREATININE 0.73 07/02/2019 1058   CALCIUM 9.6 10/07/2019 0948   PROT 7.3 07/02/2019 1058   ALBUMIN 4.5 07/02/2019 1058   AST 20 07/02/2019 1058   ALT 12 07/02/2019 1058   ALKPHOS 61 07/02/2019 1058   BILITOT 0.2 07/02/2019 1058   GFRNONAA 106 07/02/2019 1058   GFRAA 122 07/02/2019 1058     Lipid Panel ( most recent) Lipid Panel     Component  Value Date/Time   CHOL 157 07/02/2019 1058   TRIG 73 07/02/2019 1058   HDL 42 07/02/2019 1058   CHOLHDL 3.7 07/02/2019 1058   LDLCALC 101 (H) 07/02/2019 1058   LABVLDL 14 07/02/2019 1058      Lab Results  Component Value Date   TSH 2.630 06/21/2020   TSH 1.410 01/06/2020   TSH 117.553 (  H) 11/19/2019   TSH 3.940 10/07/2019   TSH 5.030 (H) 09/28/2019   TSH 1.960 07/02/2019   FREET4 1.20 06/21/2020   FREET4 1.51 01/06/2020   FREET4 1.21 (H) 11/19/2019   FREET4 1.72 10/07/2019   FREET4 1.06 09/28/2019      Assessment & Plan:   1. Postsurgical hypothyroidism 2. Malignant neoplasm of thyroid gland ([place]) - Tawyna D Maxfield  is being seen at a kind request of Gottschalk, Ashly M, DO. - I have reviewed her available thyroid records and clinically evaluated the patient. 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   Pathologic Stage Classification (pTNM, AJCC 8th Edition): pT2, pN not  assigned (no nodes submitted or found)   -In light of her follicular variant PTC, stage 2 cancer, relative youth of the patient, she was offered  adjuvant therapy with I-131 thyroid remnant ablation. This treatment was administered on November 19, 2019.  Post therapy whole-body scan results are consistent with significant uptake in the neck, inconclusive for distant metastases-nondiagnostic due to the degree of uptake of tracer at the neck, despite shielding.     -Her surveillance   thyroid/neck ultrasound in April 2022 was negative for any remnant or residual nor lymphadenopathy.    -  She will be considered for  another Thyrogen stimulated whole-body scan before her next visit in 6 months.     She has recovered well from her surgery.  Her previsit thyroid function tests are consistent with appropriate replacement.   She is advised to continue levothyroxine 100 mcg p.o. daily before breakfast.     - We discussed about the correct intake of her thyroid hormone, on empty stomach at fasting, with water, separated by at least 30 minutes from breakfast and other medications,  and separated by more than 4 hours from calcium, iron, multivitamins, acid reflux medications (PPIs). -Patient is made aware of the fact that thyroid hormone replacement is needed for life, dose to be adjusted by periodic monitoring of thyroid function tests.    She would benefit from continued supplement with Os-Cal 500/200 mg tablet once a day.     - she is advised to maintain close follow up with Janora Norlander, DO for primary care needs.     I spent 31 minutes in the care of the patient today including review of labs from Thyroid Function, CMP, and other relevant labs ; imaging/biopsy records (current and previous including abstractions from other facilities); face-to-face time discussing  her lab results and symptoms, medications doses, her options of short and long term treatment based on the latest standards of care / guidelines;   and documenting the encounter.  Shakeera D Haik  participated in the discussions, expressed understanding, and voiced agreement with the above plans.  All questions were answered to her satisfaction. she is encouraged to contact clinic should she have any questions or concerns prior to her return visit.    Follow up plan: Return in about 6 months (around 01/12/2021) for F/U with Pre-visit Labs.   Glade Lloyd, MD [facility] [address] [place], [place] [address] Phone: [phone]  Fax: [phone]     07/12/2020, 1:43 PM  This note was partially dictated with voice recognition software. Similar sounding words can be transcribed  inadequately or may not  be corrected upon review.

## 2021-01-17 ENCOUNTER — Ambulatory Visit (INDEPENDENT_AMBULATORY_CARE_PROVIDER_SITE_OTHER): Payer: 59 | Admitting: Family Medicine

## 2021-01-17 ENCOUNTER — Other Ambulatory Visit: Payer: Self-pay

## 2021-01-17 ENCOUNTER — Encounter: Payer: Self-pay | Admitting: Family Medicine

## 2021-01-17 VITALS — BP 125/85 | HR 88 | Temp 97.6°F | Ht 65.0 in | Wt 152.0 lb

## 2021-01-17 DIAGNOSIS — Z85850 Personal history of malignant neoplasm of thyroid: Secondary | ICD-10-CM | POA: Diagnosis not present

## 2021-01-17 DIAGNOSIS — M7989 Other specified soft tissue disorders: Secondary | ICD-10-CM

## 2021-01-17 LAB — CBC WITH DIFFERENTIAL/PLATELET
Basophils Absolute: 0.1 10*3/uL (ref 0.0–0.2)
Basos: 1 %
EOS (ABSOLUTE): 0.3 10*3/uL (ref 0.0–0.4)
Eos: 5 %
Hematocrit: 42.6 % (ref 34.0–46.6)
Hemoglobin: 14.1 g/dL (ref 11.1–15.9)
Immature Grans (Abs): 0 10*3/uL (ref 0.0–0.1)
Immature Granulocytes: 0 %
Lymphocytes Absolute: 2 10*3/uL (ref 0.7–3.1)
Lymphs: 35 %
MCH: 31.4 pg (ref 26.6–33.0)
MCHC: 33.1 g/dL (ref 31.5–35.7)
MCV: 95 fL (ref 79–97)
Monocytes Absolute: 0.5 10*3/uL (ref 0.1–0.9)
Monocytes: 9 %
Neutrophils Absolute: 2.9 10*3/uL (ref 1.4–7.0)
Neutrophils: 50 %
Platelets: 271 10*3/uL (ref 150–450)
RBC: 4.49 x10E6/uL (ref 3.77–5.28)
RDW: 11.8 % (ref 11.7–15.4)
WBC: 5.8 10*3/uL (ref 3.4–10.8)

## 2021-01-17 NOTE — Progress Notes (Signed)
Subjective:  Patient ID: Brenda [name], female    DOB: 03-05-1981, 40 y.o.   MRN: [id number]  Patient Care Team: Janora Norlander, DO as PCP - General (Family Medicine)   Chief Complaint:  swollen collarbone   HPI: Brenda [name] is a 40 y.o. female presenting on 01/17/2021 for swollen collarbone   Pt presents today for evaluation of bilateral clavicle swelling. States she noticed this about a month ago. No reported injuries. She has a history of thyroid cancer and is s/p total thyroidectomy. She was seen by her endocrinologist and was told to come to PCP for evaluation and possible imaging. She denies any signs of symptoms of infectious process. No fever, chills, weight loss, fatigue, weakness, confusion, or abdominal pain.    Relevant past medical, surgical, family, and social history reviewed and updated as indicated.  Allergies and medications reviewed and updated. Data reviewed: Chart in Epic.   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  Tobacco Use   Smoking status: Never   Smokeless tobacco: Never  Vaping Use   Vaping Use: Never used  Substance and Sexual Activity   Alcohol use: Never   Drug use: Yes    Types: Marijuana   Sexual activity: Yes    Birth control/protection: Surgical    Comment: tubal  Other Topics Concern   Not on file  Social History Narrative   Not on file   Social Determinants of Health   Financial  Resource Strain: Not on file  Food Insecurity: Not on file  Transportation Needs: Not on file  Physical Activity: Not on file  Stress: Not on file  Social Connections: Not on file  Intimate Partner Violence: Not on file    Outpatient Encounter Medications as of 01/17/2021  Medication Sig   albuterol (VENTOLIN HFA) 108 (90 Base) MCG/ACT inhaler Inhale 2 puffs into the lungs every 6 (six) hours as needed for wheezing or shortness of breath.   Calcium Carb-Cholecalciferol (CALCIUM 500 + D PO) Take 1 tablet by mouth 2 (two) times a week.   escitalopram (LEXAPRO) 20 MG tablet Take 1 tablet (20 mg total) by mouth daily. (Patient taking differently: Take 20 mg by mouth daily as needed.)   fluticasone furoate-vilanterol (BREO ELLIPTA) 100-25 MCG/INH AEPB Inhale 1 puff into the lungs daily. (Patient taking differently: Inhale 1 puff into the lungs daily as needed.)   levothyroxine (SYNTHROID) 112 MCG tablet Take 1 tablet (112 mcg total) by mouth daily before breakfast. TAKE ONE (1) TABLET EACH DAY (Patient taking differently: Take 100 mcg by mouth daily before breakfast. TAKE ONE (1) TABLET EACH DAY)   omeprazole (PRILOSEC) 20 MG capsule Take 1 capsule (20 mg total) by mouth daily. To REPLACE Protonix   No facility-administered encounter medications on file as of 01/17/2021.    No Known Allergies  Review of Systems  Constitutional:  Negative for activity change, appetite change, chills, diaphoresis, fatigue, fever and unexpected weight change.  HENT: Negative.    Eyes: Negative.   Respiratory:  Negative for cough, chest tightness and shortness of breath.   Cardiovascular:  Negative for chest pain, palpitations and leg swelling.  Gastrointestinal:  Negative for abdominal pain, blood in stool, constipation, diarrhea, nausea and vomiting.  Endocrine: Negative.   Genitourinary:  Negative for decreased urine volume, difficulty urinating, dysuria, frequency and urgency.  Musculoskeletal:  Negative for  arthralgias and myalgias.  Skin: Negative.   Allergic/Immunologic: Negative.   Neurological:  Negative for dizziness, weakness and headaches.  Hematological: Negative.   Psychiatric/Behavioral:  Negative for confusion, hallucinations, sleep disturbance and suicidal ideas.   All other systems reviewed and are negative.      Objective:  BP 125/85   Pulse 88   Temp 97.6 F (36.4 C)   Ht 5\' 5"  (1.651 m)   Wt 152 lb (68.9 kg)   LMP 01/11/2021 (Approximate)   SpO2 98%   BMI 25.29 kg/m    Wt Readings from Last 3 Encounters:  01/17/21 152 lb (68.9 kg)  01/15/21 149 lb 12.8 oz (67.9 kg)  09/25/20 146 lb 6.4 oz (66.4 kg)    Physical Exam Vitals and nursing note reviewed.  Constitutional:      General: She is not in acute distress.    Appearance: Normal appearance. She is well-developed, well-groomed and normal weight. She is not ill-appearing, toxic-appearing or diaphoretic.  HENT:     Head: Normocephalic and atraumatic.     Jaw: There is normal jaw occlusion.     Right Ear: Hearing normal.     Left Ear: Hearing normal.     Nose: Nose normal.     Mouth/Throat:     Lips: Pink.     Mouth: Mucous membranes are moist.     Pharynx: Oropharynx is clear. Uvula midline.  Eyes:     General: Lids are normal.     Extraocular Movements: Extraocular movements intact.     Conjunctiva/sclera: Conjunctivae normal.     Pupils: Pupils are equal, round, and reactive to light.  Neck:     Vascular: No carotid bruit or JVD.     Trachea: Trachea and phonation normal.      Comments: Thyroidectomy scar well healed.  Cardiovascular:     Rate and Rhythm: Normal rate and regular rhythm.     Chest Wall: PMI is not displaced.     Pulses: Normal pulses.     Heart sounds: Normal heart sounds. No murmur heard.   No friction rub. No gallop.  Pulmonary:     Effort: Pulmonary effort is normal. No respiratory distress.     Breath sounds: Normal breath sounds. No wheezing.  Abdominal:     General:  There is no abdominal bruit.     Palpations: There is no hepatomegaly or splenomegaly.  Musculoskeletal:        General: Normal range of motion.     Cervical back: Normal range of motion and neck supple.     Right lower leg: No edema.     Left lower leg: No edema.  Lymphadenopathy:     Cervical: No cervical adenopathy.     Right cervical: No superficial, deep or posterior cervical adenopathy.    Left cervical: No superficial, deep or posterior cervical adenopathy.  Skin:    General: Skin is warm and dry.     Capillary Refill: Capillary refill takes less than 2 seconds.  Coloration: Skin is not cyanotic, jaundiced or pale.     Findings: No rash.  Neurological:     General: No focal deficit present.     Mental Status: She is alert and oriented to person, place, and time.     Sensory: Sensation is intact.     Motor: Motor function is intact.     Coordination: Coordination is intact.     Gait: Gait is intact.     Deep Tendon Reflexes: Reflexes are normal and symmetric.  Psychiatric:        Attention and Perception: Attention and perception normal.        Mood and Affect: Mood and affect normal.        Speech: Speech normal.        Behavior: Behavior normal. Behavior is cooperative.        Thought Content: Thought content normal.        Cognition and Memory: Cognition and memory normal.        Judgment: Judgment normal.    Results for orders placed or performed in visit on 11/27/20  TSH  Result Value Ref Range   TSH 11.000 (H) 0.450 - 4.500 uIU/mL  T4, free  Result Value Ref Range   Free T4 1.48 0.82 - 1.77 ng/dL       Pertinent labs & imaging results that were available during my care of the patient were reviewed by me and considered in my medical decision making.  Assessment & Plan:  Rainee was seen today for swollen collarbone.  Diagnoses and all orders for this visit:  Mass of soft tissue History of malignant neoplasm of thyroid Bilateral supraclavicular soft  tissue swelling / mass. History of thyroid cancer. Recent thyroid panel normal. Will obtain CBC to evaluate for possible infectious process. US soft tissue neck ordered.  -     CBC with Differential/Platelet -     US Soft Tissue Head/Neck (NON-THYROID); Future     Continue all other maintenance medications.  Follow up plan: Return if symptoms worsen or fail to improve.   The above assessment and management plan was discussed with the patient. The patient verbalized understanding of and has agreed to the management plan. Patient is aware to call the clinic if they develop any new symptoms or if symptoms persist or worsen. Patient is aware when to return to the clinic for a follow-up visit. Patient educated on when it is appropriate to go to the emergency department.   Monia Pouch, FNP-C [facility] [phone]

## 2021-01-19 ENCOUNTER — Other Ambulatory Visit: Payer: Self-pay

## 2021-01-19 ENCOUNTER — Ambulatory Visit (HOSPITAL_COMMUNITY)
Admission: RE | Admit: 2021-01-19 | Discharge: 2021-01-19 | Disposition: A | Discharge status: Home / Self Care | Payer: 59 | Source: Ambulatory Visit | Attending: Family Medicine | Admitting: Family Medicine

## 2021-01-19 DIAGNOSIS — M7989 Other specified soft tissue disorders: Secondary | ICD-10-CM | POA: Diagnosis present

## 2021-01-19 DIAGNOSIS — Z85850 Personal history of malignant neoplasm of thyroid: Secondary | ICD-10-CM | POA: Insufficient documentation

## 2021-02-05 ENCOUNTER — Other Ambulatory Visit: Payer: Self-pay | Admitting: "Endocrinology

## 2021-03-06 ENCOUNTER — Ambulatory Visit (INDEPENDENT_AMBULATORY_CARE_PROVIDER_SITE_OTHER): Payer: 59 | Admitting: Family Medicine

## 2021-03-06 ENCOUNTER — Encounter: Payer: Self-pay | Admitting: Family Medicine

## 2021-03-06 DIAGNOSIS — R1110 Vomiting, unspecified: Secondary | ICD-10-CM | POA: Diagnosis not present

## 2021-03-06 DIAGNOSIS — R197 Diarrhea, unspecified: Secondary | ICD-10-CM | POA: Diagnosis not present

## 2021-03-06 MED ORDER — ONDANSETRON 8 MG PO TBDP: 8.0000 mg | ORAL_TABLET | Freq: Four times a day (QID) | ORAL | 1 refills | Status: DC | PRN

## 2021-03-06 MED ORDER — DIPHENOXYLATE-ATROPINE 2.5-0.025 MG PO TABS: 2.0000 | ORAL_TABLET | Freq: Four times a day (QID) | ORAL | 0 refills | Status: DC | PRN

## 2021-03-06 NOTE — Progress Notes (Signed)
Subjective:    Patient ID: Brenda [name], female    DOB: Sep 07, 1980, 40 y.o.   MRN: [id number]   HPI: Brenda [name] is a 40 y.o. female presenting for stomach messed up. Occurs about once a month. Three times this month. Onset 4 days ago. Had vomiting and diarrhea for a whole day on the first day. Next day felt better. Yesterday had diarrhea and vomiting. Bad cold sweats. Better today. Holding down some solids.  Has a lot of nerve problems and little brother passed away a month ago.  Depression screen [facility] 2/9 01/17/2021 09/25/2020 04/13/2020 03/16/2020 07/02/2019  Decreased Interest 1 0 0 1 1  Down, Depressed, Hopeless 1 1 0 2 1  PHQ - 2 Score 2 1 0 3 2  Altered sleeping 0 1 0 1 2  Tired, decreased energy 1 1 0 2 1  Change in appetite 1 1 0 2 1  Feeling bad or failure about yourself  1 1 0 - 1  Trouble concentrating 0 0 0 1 2  Moving slowly or fidgety/restless 0 0 0 0 1  Suicidal thoughts 0 0 0 0 0  PHQ-9 Score 5 5 0 9 10  Difficult doing work/chores Not difficult at all Somewhat difficult - Somewhat difficult Somewhat difficult     Relevant past medical, surgical, family and social history reviewed and updated as indicated.  Interim medical history since our last visit reviewed. Allergies and medications reviewed and updated.  ROS:  Review of Systems  Constitutional: Negative.  Negative for fever.  HENT: Negative.    Eyes:  Negative for visual disturbance.  Respiratory:  Negative for shortness of breath.   Cardiovascular:  Positive for chest pain.  Gastrointestinal:  Positive for diarrhea, nausea and vomiting. Negative for abdominal pain.  Musculoskeletal:  Negative for arthralgias.    Social History   Tobacco Use  Smoking Status Never  Smokeless Tobacco Never       Objective:     Wt Readings from Last 3 Encounters:  01/17/21 152 lb (68.9 kg)  01/15/21 149 lb 12.8 oz (67.9 kg)  09/25/20 146 lb 6.4 oz (66.4 kg)     Exam deferred. Pt. Harboring due to COVID 19.  Phone visit performed.   Assessment & Plan:   1. Vomiting and diarrhea     Meds ordered this encounter  Medications   ondansetron (ZOFRAN-ODT) 8 MG disintegrating tablet    Sig: Take 1 tablet (8 mg total) by mouth every 6 (six) hours as needed for nausea or vomiting.    Dispense:  20 tablet    Refill:  1   diphenoxylate-atropine (LOMOTIL) 2.5-0.025 MG tablet    Sig: Take 2 tablets by mouth 4 (four) times daily as needed for diarrhea or loose stools.    Dispense:  30 tablet    Refill:  0    No orders of the defined types were placed in this encounter.     Diagnoses and all orders for this visit:  Vomiting and diarrhea  Other orders -     ondansetron (ZOFRAN-ODT) 8 MG disintegrating tablet; Take 1 tablet (8 mg total) by mouth every 6 (six) hours as needed for nausea or vomiting. -     diphenoxylate-atropine (LOMOTIL) 2.5-0.025 MG tablet; Take 2 tablets by mouth 4 (four) times daily as needed for diarrhea or loose stools.    Virtual Visit via telephone Note  I discussed the limitations, risks, security and privacy concerns of performing an evaluation and  management service by telephone and the availability of in person appointments. The patient was identified with two identifiers. Pt.expressed understanding and agreed to proceed. Pt. Is at home. Dr. Livia Snellen is in his office.  Follow Up Instructions:   I discussed the assessment and treatment plan with the patient. The patient was provided an opportunity to ask questions and all were answered. The patient agreed with the plan and demonstrated an understanding of the instructions.   The patient was advised to call back or seek an in-person evaluation if the symptoms worsen or if the condition fails to improve as anticipated.   Total minutes including chart review and phone contact time: 14   Follow up plan: No follow-ups on file.  Claretta Fraise, MD [place]

## 2021-03-07 ENCOUNTER — Other Ambulatory Visit: Payer: Self-pay | Admitting: Family Medicine

## 2021-05-02 ENCOUNTER — Other Ambulatory Visit: Payer: Self-pay

## 2021-05-02 ENCOUNTER — Ambulatory Visit (INDEPENDENT_AMBULATORY_CARE_PROVIDER_SITE_OTHER): Payer: 59 | Admitting: Gastroenterology

## 2021-05-02 ENCOUNTER — Encounter: Payer: Self-pay | Admitting: Gastroenterology

## 2021-05-02 VITALS — BP 134/89 | HR 89 | Temp 97.8°F | Ht 65.0 in | Wt 147.6 lb

## 2021-05-02 DIAGNOSIS — K219 Gastro-esophageal reflux disease without esophagitis: Secondary | ICD-10-CM

## 2021-05-02 DIAGNOSIS — R197 Diarrhea, unspecified: Secondary | ICD-10-CM | POA: Diagnosis not present

## 2021-05-02 DIAGNOSIS — R1030 Lower abdominal pain, unspecified: Secondary | ICD-10-CM

## 2021-05-02 DIAGNOSIS — R112 Nausea with vomiting, unspecified: Secondary | ICD-10-CM

## 2021-05-02 DIAGNOSIS — R109 Unspecified abdominal pain: Secondary | ICD-10-CM | POA: Insufficient documentation

## 2021-05-02 MED ORDER — POLYETHYLENE GLYCOL 3350 17 GM/SCOOP PO POWD: ORAL | 0 refills | Status: DC

## 2021-05-02 MED ORDER — DICYCLOMINE HCL 10 MG PO CAPS: 10.0000 mg | ORAL_CAPSULE | Freq: Three times a day (TID) | ORAL | 1 refills | Status: DC

## 2021-05-02 NOTE — Patient Instructions (Signed)
Please take our orders when you go for labs. If you have not heard from me within 5 business days, please call and make sure we received results.   Try dicyclomine 10mg  up to four times daily as needed for abdominal cramps and loose stool.   Take miralax one capful daily as needed. You should take a dose if no bowel movement in 48 hours.   We will be in touch with results and decide further work up. In meantime, here is some information about IBS and GERD.  Irritable Bowel Syndrome, Adult Irritable bowel syndrome (IBS) is a group of symptoms that affects the organs responsible for digestion (gastrointestinal or GI tract). IBS is not one specific disease. To regulate how the GI tract works, the body sends signals back and forth between the intestines and the brain. If you have IBS, there may be a problem with these signals. As a result, the GI tract does not function normally. The intestines may become more sensitive and overreact to certain things. This may be especially true when you eat certain foods or when you are under stress. There are four types of IBS. These may be determined based on the consistency of your stool (feces): IBS with diarrhea. IBS with constipation. Mixed IBS. Unsubtyped IBS. It is important to know which type of IBS you have. Certain treatments are more likely to be helpful for certain types of IBS. What are the causes? The exact cause of IBS is not known. What increases the risk? You may have a higher risk for IBS if you: Are female. Are younger than 38. Have a family history of IBS. Have a mental health condition, such as depression, anxiety, or post-traumatic stress disorder. Have had a bacterial infection of your GI tract. What are the signs or symptoms? Symptoms of IBS vary from person to person. The main symptom is abdominal pain or discomfort. Other symptoms usually include one or more of the following: Diarrhea, constipation, or both. Abdominal swelling or  bloating. Feeling full after eating a small or regular-sized meal. Frequent gas. Mucus in the stool. A feeling of having more stool left after a bowel movement. Symptoms tend to come and go. They may be triggered by stress, mental health conditions, or certain foods. How is this diagnosed? This condition may be diagnosed based on a physical exam, your medical history, and your symptoms. You may have tests, such as: Blood tests. Stool test. X-rays. CT scan. Colonoscopy. This is a procedure in which your GI tract is viewed with a long, thin, flexible tube. How is this treated? There is no cure for IBS, but treatment can help relieve symptoms. Treatment depends on the type of IBS you have, and may include: Changes to your diet, such as: Avoiding foods that cause symptoms. Drinking more water. Following a low-FODMAP (fermentable oligosaccharides, disaccharides, monosaccharides, and polyols) diet for up to 6 weeks, or as told by your health care provider. FODMAPs are sugars that are hard for some people to digest. Eating more fiber. Eating medium-sized meals at the same times every day. Medicines. These may include: Fiber supplements, if you have constipation. Medicine to control diarrhea (antidiarrheal medicines). Medicine to help control muscle tightening (spasms) in your GI tract (antispasmodic medicines). Medicines to help with mental health conditions, such as antidepressants or tranquilizers. Talk therapy or counseling. Working with a diet and nutrition specialist (dietitian) to help create a food plan that is right for you. Managing your stress. Follow these instructions at home: Eating  and drinking Eat a healthy diet. Eat medium-sized meals at about the same time every day. Do not eat large meals. Gradually eat more fiber-rich foods. These include whole grains, fruits, and vegetables. This may be especially helpful if you have IBS with constipation. Eat a diet low in  FODMAPs. Drink enough fluid to keep your urine pale yellow. Keep a journal of foods that seem to trigger symptoms. Avoid foods and drinks that: Contain added sugar. Make your symptoms worse. Dairy products, caffeinated drinks, and carbonated drinks can make symptoms worse for some people. General instructions Take over-the-counter and prescription medicines and supplements only as told by your health care provider. Get enough exercise. Do at least 150 minutes of moderate-intensity exercise each week. Manage your stress. Getting enough sleep and exercise can help you manage stress. Keep all follow-up visits as told by your health care provider and therapist. This is important. Alcohol Use Do not drink alcohol if: Your health care provider tells you not to drink. You are pregnant, may be pregnant, or are planning to become pregnant. If you drink alcohol, limit how much you have: 0-1 drink a day for women. 0-2 drinks a day for men. Be aware of how much alcohol is in your drink. In the [place] one drink equals one typical bottle of beer (12 oz), one-half glass of wine (5 oz), or one shot of hard liquor (1 oz). Contact a health care provider if you have: Constant pain. Weight loss. Difficulty or pain when swallowing. Diarrhea that gets worse. Get help right away if you have: Severe abdominal pain. Fever. Diarrhea with symptoms of dehydration, such as dizziness or dry mouth. Bright red blood in your stool. Stool that is black and tarry. Abdominal swelling. Vomiting that does not stop. Blood in your vomit. Summary Irritable bowel syndrome (IBS) is not one specific disease. It is a group of symptoms that affects digestion. Your intestines may become more sensitive and overreact to certain things. This may be especially true when you eat certain foods or when you are under stress. There is no cure for IBS, but treatment can help relieve symptoms. This information is not intended to replace  advice given to you by your health care provider. Make sure you discuss any questions you have with your health care provider. Document Revised: 10/21/2019 Document Reviewed: 10/28/2019 Elsevier Patient Education  2022 [place].   Diet for Irritable Bowel Syndrome When you have irritable bowel syndrome (IBS), it is very important to eat the foods and follow the eating habits that are best for your condition. IBS may cause various symptoms such as pain in the abdomen, constipation, or diarrhea. Choosing the right foods can help to ease the discomfort from these symptoms. Work with your health care provider and diet and nutrition specialist (dietitian) to find the eating plan that will help to control your symptoms. What are tips for following this plan?   Keep a food diary. This will help you identify foods that cause symptoms. Write down: What you eat and when you eat it. What symptoms you have. When symptoms occur in relation to your meals, such as "pain in abdomen 2 hours after dinner." Eat your meals slowly and in a relaxed setting. Aim to eat 5-6 small meals per day. Do not skip meals. Drink enough fluid to keep your urine pale yellow. Ask your health care provider if you should take an over-the-counter probiotic to help restore healthy bacteria in your gut (digestive tract). Probiotics are foods  that contain good bacteria and yeasts. Your dietitian may have specific dietary recommendations for you based on your symptoms. He or she may recommend that you: Avoid foods that cause symptoms. Talk with your dietitian about other ways to get the same nutrients that are in those problem foods. Avoid foods with gluten. Gluten is a protein that is found in rye, wheat, and barley. Eat more foods that contain soluble fiber. Examples of foods with high soluble fiber include oats, seeds, and certain fruits and vegetables. Take a fiber supplement if directed by your dietitian. Reduce or avoid  certain foods called FODMAPs. These are foods that contain carbohydrates that are hard to digest. Ask your doctor which foods contain these carbohydrates. What foods are not recommended? The following are some foods and drinks that may make your symptoms worse: Fatty foods, such as french fries. Foods that contain gluten, such as pasta and cereal. Dairy products, such as milk, cheese, and ice cream. Chocolate. Alcohol. Products with caffeine, such as coffee. Carbonated drinks, such as soda. Foods that are high in FODMAPs. These include certain fruits and vegetables. Products with sweeteners such as honey, high fructose corn syrup, sorbitol, and mannitol. The items listed above may not be a complete list of foods and beverages you should avoid. Contact a dietitian for more information. What foods are good sources of fiber? Your health care provider or dietitian may recommend that you eat more foods that contain fiber. Fiber can help to reduce constipation and other IBS symptoms. Add foods with fiber to your diet a little at a time so your body can get used to them. Too much fiber at one time might cause gas and swelling of your abdomen. The following are some foods that are good sources of fiber: Berries, such as raspberries, strawberries, and blueberries. Tomatoes. Carrots. Brown rice. Oats. Seeds, such as chia and pumpkin seeds. The items listed above may not be a complete list of recommended sources of fiber. Contact your dietitian for more options. Where to find more information International Foundation for Functional Gastrointestinal Disorders: www.iffgd.[employer] Diabetes and Digestive and Kidney Diseases: [employer] Summary When you have irritable bowel syndrome (IBS), it is very important to eat the foods and follow the eating habits that are best for your condition. IBS may cause various symptoms such as pain in the abdomen, constipation, or  diarrhea. Choosing the right foods can help to ease the discomfort that comes from symptoms. Keep a food diary. This will help you identify foods that cause symptoms. Your health care provider or diet and nutrition specialist (dietitian) may recommend that you eat more foods that contain fiber. This information is not intended to replace advice given to you by your health care provider. Make sure you discuss any questions you have with your health care provider. Document Revised: 10/21/2019 Document Reviewed: 10/28/2019 Elsevier Patient Education  2022 [place] for Gastroesophageal Reflux Disease, Adult When you have gastroesophageal reflux disease (GERD), the foods you eat and your eating habits are very important. Choosing the right foods can help ease the discomfort of GERD. Consider working with a dietitian to help you make healthy food choices. What are tips for following this plan? Reading food labels Look for foods that are low in saturated fat. Foods that have less than 5% of daily value (DV) of fat and 0 g of trans fats may help with your symptoms. Cooking Cook foods using methods other than frying. This may include  baking, steaming, grilling, or broiling. These are all methods that do not need a lot of fat for cooking. To add flavor, try to use herbs that are low in spice and acidity. Meal planning  Choose healthy foods that are low in fat, such as fruits, vegetables, whole grains, low-fat dairy products, lean meats, fish, and poultry. Eat frequent, small meals instead of three large meals each day. Eat your meals slowly, in a relaxed setting. Avoid bending over or lying down until 2-3 hours after eating. Limit high-fat foods such as fatty meats or fried foods. Limit your intake of fatty foods, such as oils, butter, and shortening. Avoid the following as told by your health care provider: Foods that cause symptoms. These may be different for different people.  Keep a food diary to keep track of foods that cause symptoms. Alcohol. Drinking large amounts of liquid with meals. Eating meals during the 2-3 hours before bed. Lifestyle Maintain a healthy weight. Ask your health care provider what weight is healthy for you. If you need to lose weight, work with your health care provider to do so safely. Exercise for at least 30 minutes on 5 or more days each week, or as told by your health care provider. Avoid wearing clothes that fit tightly around your waist and chest. Do not use any products that contain nicotine or tobacco. These products include cigarettes, chewing tobacco, and vaping devices, such as e-cigarettes. If you need help quitting, ask your health care provider. Sleep with the head of your bed raised. Use a wedge under the mattress or blocks under the bed frame to raise the head of the bed. Chew sugar-free gum after mealtimes. What foods should I eat? Eat a healthy, well-balanced diet of fruits, vegetables, whole grains, low-fat dairy products, lean meats, fish, and poultry. Each person is different. Foods that may trigger symptoms in one person may not trigger any symptoms in another person. Work with your health care provider to identify foods that are safe for you. The items listed above may not be a complete list of recommended foods and beverages. Contact a dietitian for more information. What foods should I avoid? Limiting some of these foods may help manage the symptoms of GERD. Everyone is different. Consult a dietitian or your health care provider to help you identify the exact foods to avoid, if any. Fruits Any fruits prepared with added fat. Any fruits that cause symptoms. For some people this may include citrus fruits, such as oranges, grapefruit, pineapple, and lemons. Vegetables Deep-fried vegetables. [place] fries. Any vegetables prepared with added fat. Any vegetables that cause symptoms. For some people, this may include tomatoes  and tomato products, chili peppers, onions and garlic, and horseradish. Grains Pastries or quick breads with added fat. Meats and other proteins High-fat meats, such as fatty beef or pork, hot dogs, ribs, ham, sausage, salami, and bacon. Fried meat or protein, including fried fish and fried chicken. Nuts and nut butters, in large amounts. Dairy Whole milk and chocolate milk. Sour cream. Cream. Ice cream. Cream cheese. Milkshakes. Fats and oils Butter. Margarine. Shortening. Ghee. Beverages Coffee and tea, with or without caffeine. Carbonated beverages. Sodas. Energy drinks. Fruit juice made with acidic fruits, such as orange or grapefruit. Tomato juice. Alcoholic drinks. Sweets and desserts Chocolate and cocoa. Donuts. Seasonings and condiments Pepper. Peppermint and spearmint. Added salt. Any condiments, herbs, or seasonings that cause symptoms. For some people, this may include curry, hot sauce, or vinegar-based salad dressings. The items listed  above may not be a complete list of foods and beverages to avoid. Contact a dietitian for more information. Questions to ask your health care provider Diet and lifestyle changes are usually the first steps that are taken to manage symptoms of GERD. If diet and lifestyle changes do not improve your symptoms, talk with your health care provider about taking medicines. Where to find more information International Foundation for Gastrointestinal Disorders: aboutgerd.org Summary When you have gastroesophageal reflux disease (GERD), food and lifestyle choices may be very helpful in easing the discomfort of GERD. Eat frequent, small meals instead of three large meals each day. Eat your meals slowly, in a relaxed setting. Avoid bending over or lying down until 2-3 hours after eating. Limit high-fat foods such as fatty meats or fried foods. This information is not intended to replace advice given to you by your health care provider. Make sure you discuss any  questions you have with your health care provider. Document Revised: 09/06/2019 Document Reviewed: 09/06/2019 Elsevier Patient Education  [place].   Gastroesophageal Reflux Disease, Adult Gastroesophageal reflux (GER) happens when acid from the stomach flows up into the tube that connects the mouth and the stomach (esophagus). Normally, food travels down the esophagus and stays in the stomach to be digested. However, when a person has GER, food and stomach acid sometimes move back up into the esophagus. If this becomes a more serious problem, the person may be diagnosed with a disease called gastroesophageal reflux disease (GERD). GERD occurs when the reflux: Happens often. Causes frequent or severe symptoms. Causes problems such as damage to the esophagus. When stomach acid comes in contact with the esophagus, the acid may cause inflammation in the esophagus. Over time, GERD may create small holes (ulcers) in the lining of the esophagus. What are the causes? This condition is caused by a problem with the muscle between the esophagus and the stomach (lower esophageal sphincter, or LES). Normally, the LES muscle closes after food passes through the esophagus to the stomach. When the LES is weakened or abnormal, it does not close properly, and that allows food and stomach acid to go back up into the esophagus. The LES can be weakened by certain dietary substances, medicines, and medical conditions, including: Tobacco use. Pregnancy. Having a hiatal hernia. Alcohol use. Certain foods and beverages, such as coffee, chocolate, onions, and peppermint. What increases the risk? You are more likely to develop this condition if you: Have an increased body weight. Have a connective tissue disorder. Take NSAIDs, such as ibuprofen. What are the signs or symptoms? Symptoms of this condition include: Heartburn. Difficult or painful swallowing and the feeling of having a lump in the throat. A  bitter taste in the mouth. Bad breath and having a large amount of saliva. Having an upset or bloated stomach and belching. Chest pain. Different conditions can cause chest pain. Make sure you see your health care provider if you experience chest pain. Shortness of breath or wheezing. Ongoing (chronic) cough or a nighttime cough. Wearing away of tooth enamel. Weight loss. How is this diagnosed? This condition may be diagnosed based on a medical history and a physical exam. To determine if you have mild or severe GERD, your health care provider may also monitor how you respond to treatment. You may also have tests, including: A test to examine your stomach and esophagus with a small camera (endoscopy). A test that measures the acidity level in your esophagus. A test that measures how much  pressure is on your esophagus. A barium swallow or modified barium swallow test to show the shape, size, and functioning of your esophagus. How is this treated? Treatment for this condition may vary depending on how severe your symptoms are. Your health care provider may recommend: Changes to your diet. Medicine. Surgery. The goal of treatment is to help relieve your symptoms and to prevent complications. Follow these instructions at home: Eating and drinking  Follow a diet as recommended by your health care provider. This may involve avoiding foods and drinks such as: Coffee and tea, with or without caffeine. Drinks that contain alcohol. Energy drinks and sports drinks. Carbonated drinks or sodas. Chocolate and cocoa. Peppermint and mint flavorings. Garlic and onions. Horseradish. Spicy and acidic foods, including peppers, chili powder, curry powder, vinegar, hot sauces, and barbecue sauce. Citrus fruit juices and citrus fruits, such as oranges, lemons, and limes. Tomato-based foods, such as red sauce, chili, salsa, and pizza with red sauce. Fried and fatty foods, such as donuts, french fries,  potato chips, and high-fat dressings. High-fat meats, such as hot dogs and fatty cuts of red and white meats, such as rib eye steak, sausage, ham, and bacon. High-fat dairy items, such as whole milk, butter, and cream cheese. Eat small, frequent meals instead of large meals. Avoid drinking large amounts of liquid with your meals. Avoid eating meals during the 2-3 hours before bedtime. Avoid lying down right after you eat. Do not exercise right after you eat. Lifestyle  Do not use any products that contain nicotine or tobacco. These products include cigarettes, chewing tobacco, and vaping devices, such as e-cigarettes. If you need help quitting, ask your health care provider. Try to reduce your stress by using methods such as yoga or meditation. If you need help reducing stress, ask your health care provider. If you are overweight, reduce your weight to an amount that is healthy for you. Ask your health care provider for guidance about a safe weight loss goal. General instructions Pay attention to any changes in your symptoms. Take over-the-counter and prescription medicines only as told by your health care provider. Do not take aspirin, ibuprofen, or other NSAIDs unless your health care provider told you to take these medicines. Wear loose-fitting clothing. Do not wear anything tight around your waist that causes pressure on your abdomen. Raise (elevate) the head of your bed about 6 inches (15 cm). You can use a wedge to do this. Avoid bending over if this makes your symptoms worse. Keep all follow-up visits. This is important. Contact a health care provider if: You have: New symptoms. Unexplained weight loss. Difficulty swallowing or it hurts to swallow. Wheezing or a persistent cough. A hoarse voice. Your symptoms do not improve with treatment. Get help right away if: You have sudden pain in your arms, neck, jaw, teeth, or back. You suddenly feel sweaty, dizzy, or light-headed. You  have chest pain or shortness of breath. You vomit and the vomit is green, yellow, or black, or it looks like blood or coffee grounds. You faint. You have stool that is red, bloody, or black. You cannot swallow, drink, or eat. These symptoms may represent a serious problem that is an emergency. Do not wait to see if the symptoms will go away. Get medical help right away. Call your local emergency services (911 in the [place] Do not drive yourself to the hospital. Summary Gastroesophageal reflux happens when acid from the stomach flows up into the esophagus. GERD is a disease  in which the reflux happens often, causes frequent or severe symptoms, or causes problems such as damage to the esophagus. Treatment for this condition may vary depending on how severe your symptoms are. Your health care provider may recommend diet and lifestyle changes, medicine, or surgery. Contact a health care provider if you have new or worsening symptoms. Take over-the-counter and prescription medicines only as told by your health care provider. Do not take aspirin, ibuprofen, or other NSAIDs unless your health care provider told you to do so. Keep all follow-up visits as told by your health care provider. This is important. This information is not intended to replace advice given to you by your health care provider. Make sure you discuss any questions you have with your health care provider. Document Revised: 09/06/2019 Document Reviewed: 09/06/2019 Elsevier Patient Education  [place].

## 2021-05-02 NOTE — Progress Notes (Signed)
Primary Care Physician:  Janora Norlander, DO  Primary Gastroenterologist:  Elon Alas. Abbey Chatters, DO   Chief Complaint  Patient presents with   Abdominal Pain    Lower abd at times. Has vomiting and diarrhea. Had 3 episodes in December. Twisting like pain.  Upper abd burning   Gastroesophageal Reflux    HPI:  Brenda [name] is a 41 y.o. female here for further evaluation of episodic lower abdominal pain associated with N/V and diarrhea. She also has GERD.  Patient states she has mild symptoms occurring about once every couple of months for awhile but in December she had 3 episodes. Generally she develops severe abdominal cramping followed by both diarrhea and vomiting. Symptoms can last for couple of days. She is aware of lactose intolerance, avoids regular milk. Occasionally eat a little cheese and does ok. She is not sure if she is eating something to cause her symptoms or if it is brought on by her anxiety. Symptoms worsened in December after her younger brother committed suicide. In between episodes her BMs are anywhere from going a week without a stool to having few stools per day, [place] 4-5. No melena, brbpr. She has some epigastric burning at times. Heartburn and indigestion bad a times. No dysphagia. She has been on pantoprazole and recently switched to omeprazole which is helping some. Notes getting hot can bring on her stomach issues. Notes sometimes can tolerate certain foods like [place] but other times with have postprandial diarrhea. Patient notes some weight loss after brother's death but she is having better appetite these days. Last episode of abdominal pain, vomiting, diarrhea about 3 weeks ago. Takes advil about once per week but no ASA. Smokes marijuana once per week.   She had thyroid cancer two years ago. She had surgery and iodine treatment. Has upcoming surveillance bone scan.    Current Outpatient Medications  Medication Sig Dispense Refill   albuterol (VENTOLIN HFA)  108 (90 Base) MCG/ACT inhaler USE 2 PUFFS EVERY 6 HOURS AS NEEDED 8.5 g 0   Calcium Carb-Cholecalciferol (CALCIUM 500 + D PO) Take 1 tablet by mouth 2 (two) times a week.     diphenoxylate-atropine (LOMOTIL) 2.5-0.025 MG tablet Take 2 tablets by mouth 4 (four) times daily as needed for diarrhea or loose stools. 30 tablet 0   escitalopram (LEXAPRO) 20 MG tablet Take 1 tablet (20 mg total) by mouth daily. (Patient taking differently: Take 20 mg by mouth daily as needed.) 90 tablet 0   levothyroxine (SYNTHROID) 100 MCG tablet TAKE ONE (1) TABLET EACH DAY 90 tablet 0   omeprazole (PRILOSEC) 20 MG capsule Take 1 capsule (20 mg total) by mouth daily. To REPLACE Protonix 90 capsule 3   ondansetron (ZOFRAN-ODT) 8 MG disintegrating tablet Take 1 tablet (8 mg total) by mouth every 6 (six) hours as needed for nausea or vomiting. 20 tablet 1   No current facility-administered medications for this visit.    Allergies as of 05/02/2021 - Review Complete 05/02/2021  Allergen Reaction Noted   Milk-related compounds Nausea And Vomiting 05/02/2021    Past Medical History:  Diagnosis Date   Anxiety    Malignant neoplasm of thyroid gland ([place]) 09/30/2019    Past Surgical History:  Procedure Laterality Date   APPENDECTOMY     THYROIDECTOMY Left 09/14/2019   Procedure: LEFT HEMITHYROIDECTOMY;  Surgeon: Leta Baptist, MD;  Location: [place];  Service: ENT;  Laterality: Left;    Family History  Problem Relation Age of Onset  Anxiety disorder Mother    Asthma Mother    COPD Mother    Hypertension Mother    Hyperlipidemia Mother    Osteoporosis Mother    Diabetes Mother    Heart disease Father    Heart attack Father    Bipolar disorder Sister    Schizophrenia Sister    Drug abuse Sister    Hyperlipidemia Maternal Grandmother    Hypertension Maternal Grandmother    Stroke Maternal Grandmother    Alzheimer's disease Maternal Grandmother    Cancer Maternal Grandfather    Heart disease  Maternal Grandfather    Kidney disease Paternal Grandmother    Cancer Paternal Grandfather    Drug abuse Brother    Anxiety disorder Brother    Depression Brother    Thyroid disease Brother    Asthma Son     Social History   Socioeconomic History   Marital status: Married    Spouse name: [place]   Number of children: 2   Years of education: some college   Highest education level: Not on file  Occupational History    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 occasional marijuana (once weekly)   Sexual activity: Yes    Birth control/protection: Surgical    Comment: tubal  Other Topics Concern   Not on file  Social History Narrative   Not on file   Social Determinants of Health   Financial Resource Strain: Not on file  Food Insecurity: Not on file  Transportation Needs: Not on file  Physical Activity: Not on file  Stress: Not on file  Social Connections: Not on file  Intimate Partner Violence: Not on file      ROS:  General: Negative for anorexia, weight loss, fever, chills, fatigue, weakness. Eyes: Negative for vision changes.  ENT: Negative for hoarseness, difficulty swallowing , nasal congestion. CV: Negative for chest pain, angina, palpitations, dyspnea on exertion, peripheral edema.  Respiratory: Negative for dyspnea at rest, dyspnea on exertion, cough, sputum, wheezing.  GI: See history of present illness. GU:  Negative for dysuria, hematuria, urinary incontinence, urinary frequency, nocturnal urination.  MS: Negative for joint pain, low back pain.  Derm: Negative for rash or itching.  Neuro: Negative for weakness, abnormal sensation, seizure, frequent headaches, memory loss, confusion.  Psych:  Negative for depression, suicidal ideation, hallucinations. +anxiety Endo: Negative for unusual weight change.  Heme: Negative for bruising or bleeding. Allergy: Negative for rash or hives.    Physical Examination:  BP 134/89    Pulse 89    Temp 97.8 F (36.6 C) (Temporal)    Ht 5\' 5"  (1.651 m)    Wt 147 lb 9.6 oz (67 kg)    LMP 04/18/2021 (Approximate)    BMI 24.56 kg/m    General: Well-nourished, well-developed in no acute distress.  Head: Normocephalic, atraumatic.   Eyes: Conjunctiva pink, no icterus. Mouth:masked Neck: Supple without thyromegaly, masses, or lymphadenopathy.  Lungs: Clear to auscultation bilaterally.  Heart: Regular rate and rhythm, no murmurs rubs or gallops.  Abdomen: Bowel sounds are normal, nontender, nondistended, no hepatosplenomegaly or masses, no abdominal bruits or    hernia , no rebound or  guarding.   Rectal: not performed Extremities: No lower extremity edema. No clubbing or deformities.  Neuro: Alert and oriented x 4 , grossly normal neurologically.  Skin: Warm and dry, no rash or jaundice.   Psych: Alert and cooperative, normal mood and affect.  Labs: Lab Results  Component Value Date   WBC 5.8 01/17/2021   HGB 14.1 01/17/2021   HCT 42.6 01/17/2021   MCV 95 01/17/2021   PLT 271 01/17/2021   Lab Results  Component Value Date   CREATININE 0.81 10/09/2020   BUN 9 10/09/2020   NA 139 10/09/2020   K 4.9 10/09/2020   CL 101 10/09/2020   CO2 22 10/09/2020   Lab Results  Component Value Date   ALT 15 10/09/2020   AST 21 10/09/2020   ALKPHOS 76 10/09/2020   BILITOT 0.4 10/09/2020   Lab Results  Component Value Date   TSH 1.390 01/03/2021    Lab Results  Component Value Date   [id number] 10/09/2020   No results found for: FOLATE  Imaging Studies: No results found.   Assessment:  Episodic lower abdominal pain associated with N/V, diarrhea: symptoms used to occur about once every 2 months. Increased frequency in December  after her brother committed suicide. Mother has history of IBS. No known family history of celiac disease, inflammatory bowel disease, or colon cancer. Patient is aware of lactose intolerance which can cause similar symptoms for her. She may have IBS, recently worsened after unexpected death of her younger brother; however, we need to consider other etiologies given chronicity of symptoms.   Chronic GERD: has been on H2 blockers in the past. More recently on pantoprazole and then omeprazole. Symptoms somewhat improved. No dysphagia or other alarm symptoms at this time.   Plan: Try dicyclomine 10mg  up to four times daily for abdominal pain, diarrhea.  Take miralax one capful daily for constipation. If no BM for 48 hours, then she should take dose of miralax. Goal of soft regular BMs. Do not wait 4-5 days without BM to start addressing constipation.  Information about IBS and GERD provided today. Patient to get labs at [facility] with our orders. Once labs are back, we will decide next step. Cannot exclude possibility of CT A/P with contrast (although if labs normal this should be done near next episode), EGD, and/or colonoscopy.

## 2021-05-07 ENCOUNTER — Other Ambulatory Visit: Payer: 59

## 2021-05-08 LAB — TISSUE TRANSGLUTAMINASE, IGA: Transglutaminase IgA: <2 U/mL (ref 0–3)

## 2021-05-08 LAB — C-REACTIVE PROTEIN: CRP: <1 mg/L (ref 0–10)

## 2021-05-08 LAB — SEDIMENTATION RATE: Sed Rate: 14 mm/hr (ref 0–32)

## 2021-05-08 LAB — IGA: IgA/Immunoglobulin A, Serum: 270 mg/dL (ref 87–352)

## 2021-05-12 ENCOUNTER — Other Ambulatory Visit: Payer: Self-pay | Admitting: "Endocrinology

## 2021-05-30 ENCOUNTER — Encounter: Payer: Self-pay | Admitting: Gastroenterology

## 2021-06-04 ENCOUNTER — Telehealth: Payer: Self-pay | Admitting: "Endocrinology

## 2021-06-04 NOTE — Telephone Encounter (Signed)
Patient is requesting a call back in regards to her whole body scan that is next week ?

## 2021-06-04 NOTE — Telephone Encounter (Signed)
Discussed with pt that we have received authorization for her whole body scan. Understanding voiced. ?

## 2021-06-15 NOTE — Written Directive (Addendum)
MOLECULAR IMAGING AND THERAPEUTICS WRITTEN DIRECTIVE ? ? ?PATIENT NAME: Brenda [name] ? ?PT DOB:   24-May-1980 ?                                             ?MRN: [id number] ? ?--------------------------------------------------------------------------------------------------------------------- ? ?I-131 WHOLE BODY SCAN ? ? ? ?RADIOPHARMACEUTICAL: Iodine-131 Capsule for Diagnostic Imaging ? ? ?PRESCRIBED DOSE FOR ADMINISTRATION: 4.0 mCi  (four milliCuries) ? ? ?ROUTE OFADMINISTRATION: PO ? ? ?DIAGNOSIS: Thyroid Cancer  ? ? ?REFERRING PHYSICIAN: Dr. Dorris Fetch ? ? ?THYROGEN STIMULATION OR HORMONE WITHDRAW: Thyrogen Stimulation ? ? ?DATE OF THYROIDECTOMY: 09/21/2019 ? ? ?SURGEON: Dr. Benjamine Mola ? ? ?TSH:   ?Lab Results  ?Component Value Date  ? TSH 1.390 01/03/2021  ? TSH 11.000 (H) 11/27/2020  ? TSH 3.290 10/09/2020  ? ? ? ?PRIOR I-131 THERAPY (Date and Dose): 11/19/2019 50.0 mCi ? ? ?ADDITIONAL PHYSICIAN COMMENTS/NOTES ? ? ?AUTHORIZED USER SIGNATURE & TIME STAMP: ? ?

## 2021-06-20 ENCOUNTER — Encounter (HOSPITAL_COMMUNITY): Payer: Self-pay

## 2021-06-20 ENCOUNTER — Ambulatory Visit (HOSPITAL_COMMUNITY)
Admission: RE | Admit: 2021-06-20 | Discharge: 2021-06-20 | Disposition: A | Discharge status: Home / Self Care | Payer: 59 | Source: Ambulatory Visit | Attending: "Endocrinology | Admitting: "Endocrinology

## 2021-06-20 DIAGNOSIS — E890 Postprocedural hypothyroidism: Secondary | ICD-10-CM | POA: Diagnosis not present

## 2021-06-20 DIAGNOSIS — C73 Malignant neoplasm of thyroid gland: Secondary | ICD-10-CM | POA: Insufficient documentation

## 2021-06-20 MED ORDER — THYROTROPIN ALFA 0.9 MG IM SOLR
INTRAMUSCULAR | Status: AC
  Administered 2021-06-20: 0.9 mg
  Filled 2021-06-20: qty 0.9

## 2021-06-20 MED ORDER — STERILE WATER FOR INJECTION IJ SOLN
INTRAMUSCULAR | Status: AC
  Administered 2021-06-20: 1.2 mL
  Filled 2021-06-20: qty 10

## 2021-06-21 ENCOUNTER — Ambulatory Visit (HOSPITAL_COMMUNITY)
Admission: RE | Admit: 2021-06-21 | Discharge: 2021-06-21 | Disposition: A | Discharge status: Home / Self Care | Payer: 59 | Source: Ambulatory Visit | Attending: "Endocrinology | Admitting: "Endocrinology

## 2021-06-21 DIAGNOSIS — E890 Postprocedural hypothyroidism: Secondary | ICD-10-CM | POA: Diagnosis not present

## 2021-06-21 MED ORDER — THYROTROPIN ALFA 0.9 MG IM SOLR
INTRAMUSCULAR | Status: AC
  Filled 2021-06-21: qty 0.9

## 2021-06-21 MED ORDER — THYROTROPIN ALFA 0.9 MG IM SOLR
0.9000 mg | INTRAMUSCULAR | Status: AC
  Administered 2021-06-21: 0.9 mg via INTRAMUSCULAR

## 2021-06-21 MED ORDER — STERILE WATER FOR INJECTION IJ SOLN
INTRAMUSCULAR | Status: AC
  Filled 2021-06-21: qty 10

## 2021-06-22 ENCOUNTER — Ambulatory Visit (HOSPITAL_COMMUNITY)
Admission: RE | Admit: 2021-06-22 | Discharge: 2021-06-22 | Disposition: A | Discharge status: Home / Self Care | Payer: 59 | Source: Ambulatory Visit | Attending: "Endocrinology | Admitting: "Endocrinology

## 2021-06-22 ENCOUNTER — Other Ambulatory Visit: Payer: Self-pay | Admitting: "Endocrinology

## 2021-06-22 ENCOUNTER — Other Ambulatory Visit (HOSPITAL_COMMUNITY)
Admission: RE | Admit: 2021-06-22 | Discharge: 2021-06-22 | Disposition: A | Discharge status: Home / Self Care | Payer: 59 | Source: Ambulatory Visit | Attending: "Endocrinology | Admitting: "Endocrinology

## 2021-06-22 DIAGNOSIS — E890 Postprocedural hypothyroidism: Secondary | ICD-10-CM | POA: Diagnosis present

## 2021-06-22 DIAGNOSIS — C73 Malignant neoplasm of thyroid gland: Secondary | ICD-10-CM | POA: Diagnosis present

## 2021-06-22 LAB — T4, FREE: Free T4: 1.21 ng/dL — ABNORMAL HIGH (ref 0.61–1.12)

## 2021-06-22 LAB — COMPREHENSIVE METABOLIC PANEL
ALT: 20 U/L (ref 0–44)
AST: 23 U/L (ref 15–41)
Albumin: 4.6 g/dL (ref 3.5–5.0)
Alkaline Phosphatase: 60 U/L (ref 38–126)
Anion gap: 9 (ref 5–15)
BUN: 9 mg/dL (ref 6–20)
CO2: 21 mmol/L — ABNORMAL LOW (ref 22–32)
Calcium: 9 mg/dL (ref 8.9–10.3)
Chloride: 107 mmol/L (ref 98–111)
Creatinine, Ser: 0.74 mg/dL (ref 0.44–1.00)
GFR, Estimated: >60 mL/min (ref >60)
Glucose, Bld: 100 mg/dL — ABNORMAL HIGH (ref 70–99)
Potassium: 3.3 mmol/L — ABNORMAL LOW (ref 3.5–5.1)
Sodium: 137 mmol/L (ref 135–145)
Total Bilirubin: 0.9 mg/dL (ref 0.3–1.2)
Total Protein: 8.2 g/dL — ABNORMAL HIGH (ref 6.5–8.1)

## 2021-06-22 LAB — TSH: TSH: 151.484 u[IU]/mL — ABNORMAL HIGH (ref 0.350–4.500)

## 2021-06-22 MED ORDER — SODIUM IODIDE I 131 CAPSULE
3.9900 | Freq: Once | INTRAVENOUS | Status: AC | PRN
  Administered 2021-06-22: 3.99 via ORAL

## 2021-06-22 MED ORDER — POTASSIUM CHLORIDE ER 20 MEQ PO TBCR: EXTENDED_RELEASE_TABLET | ORAL | 1 refills | Status: DC

## 2021-06-25 ENCOUNTER — Ambulatory Visit (HOSPITAL_COMMUNITY)
Admission: RE | Admit: 2021-06-25 | Discharge: 2021-06-25 | Disposition: A | Discharge status: Home / Self Care | Payer: 59 | Source: Ambulatory Visit | Attending: "Endocrinology | Admitting: "Endocrinology

## 2021-06-25 DIAGNOSIS — E890 Postprocedural hypothyroidism: Secondary | ICD-10-CM | POA: Diagnosis not present

## 2021-06-30 LAB — THYROGLOBULIN LEVEL: Thyroglobulin: <2 ng/mL

## 2021-07-16 ENCOUNTER — Encounter: Payer: Self-pay | Admitting: "Endocrinology

## 2021-07-16 ENCOUNTER — Ambulatory Visit (INDEPENDENT_AMBULATORY_CARE_PROVIDER_SITE_OTHER): Payer: 59 | Admitting: "Endocrinology

## 2021-07-16 VITALS — BP 116/82 | HR 64 | Ht 65.0 in | Wt 149.0 lb

## 2021-07-16 DIAGNOSIS — E876 Hypokalemia: Secondary | ICD-10-CM | POA: Insufficient documentation

## 2021-07-16 DIAGNOSIS — E890 Postprocedural hypothyroidism: Secondary | ICD-10-CM | POA: Diagnosis not present

## 2021-07-16 DIAGNOSIS — C73 Malignant neoplasm of thyroid gland: Secondary | ICD-10-CM | POA: Diagnosis not present

## 2021-07-16 MED ORDER — LEVOTHYROXINE SODIUM 88 MCG PO TABS: ORAL_TABLET | ORAL | 1 refills | Status: DC

## 2021-07-16 NOTE — Progress Notes (Signed)
? ?    ?                                           07/16/2021, 2:50 PM ?    ? ?Endocrinology follow-up note ? ? ?Subjective:  ? ? Patient ID: Brenda [name], female    DOB: 12/13/1980, PCP Janora Norlander, DO ? ? ?Past Medical History:  ?Diagnosis Date  ? Anxiety   ? Malignant neoplasm of thyroid gland ([place]) 09/30/2019  ? ?Past Surgical History:  ?Procedure Laterality Date  ? APPENDECTOMY    ? THYROIDECTOMY Left 09/14/2019  ? Procedure: LEFT HEMITHYROIDECTOMY;  Surgeon: Leta Baptist, MD;  Location: [place];  Service: ENT;  Laterality: Left;  ? ?Social History  ? ?Socioeconomic History  ? Marital status: Married  ?  Spouse name: [place]  ? Number of children: 2  ? Years of education: some college  ? Highest education level: Not on file  ?Occupational History  ?  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 Smoking status: Never  ? Smokeless tobacco: Never  ?Vaping Use  ? Vaping Use: Never used  ?Substance and Sexual Activity  ? Alcohol use: Not Currently  ?  Comment: occ etoh in past  ? Drug use: Yes  ?  Types: Marijuana  ?  Comment: no history of IV/intranasal drug use. occasional marijuana (once weekly)  ? Sexual activity: Yes  ?  Birth control/protection: Surgical  ?  Comment: tubal  ?Other Topics Concern  ? Not on file  ?Social History Narrative  ? Not on file  ? ?Social Determinants of Health  ? ?Financial Resource Strain: Not on file  ?Food Insecurity: Not on file  ?Transportation Needs: Not on file  ?Physical Activity: Not on file  ?Stress: Not on file  ?Social Connections: Not on file  ? ?Family History  ?Problem Relation Age of Onset  ? Anxiety disorder Mother   ? Asthma Mother   ? COPD Mother   ? Hypertension Mother   ? Hyperlipidemia Mother   ? Osteoporosis Mother   ? Diabetes Mother   ? Irritable bowel syndrome Mother   ? Heart disease Father   ? Heart attack Father   ? Bipolar disorder Sister   ? Schizophrenia Sister   ? Drug abuse Sister   ? Drug abuse Brother   ? Anxiety disorder Brother    ? Depression Brother   ? Thyroid disease Brother   ? Hyperlipidemia Maternal Grandmother   ? Hypertension Maternal Grandmother   ? Stroke Maternal Grandmother   ? Alzheimer's disease Maternal Grandmother   ? Cancer Maternal Grandfather   ?     lung  ? Heart disease Maternal Grandfather   ? Kidney disease Paternal Grandmother   ? Cancer Paternal Grandfather   ? Asthma Son   ? Colon cancer Neg Hx   ? Inflammatory bowel disease Neg Hx   ? Celiac disease Neg Hx   ? ?Outpatient Encounter Medications as of 07/16/2021  ?Medication Sig  ? albuterol (VENTOLIN HFA) 108 (90 Base) MCG/ACT inhaler USE 2 PUFFS EVERY 6 HOURS AS NEEDED  ? Calcium Carb-Cholecalciferol (CALCIUM 500 + D PO) Take 1 tablet by mouth 2 (two) times a week.  ? dicyclomine (BENTYL) 10 MG capsule Take 1 capsule (10 mg total) by mouth 4 (four) times daily -  before meals and at  bedtime. As needed for abdominal cramps and diarrhea  ? diphenoxylate-atropine (LOMOTIL) 2.5-0.025 MG tablet Take 2 tablets by mouth 4 (four) times daily as needed for diarrhea or loose stools.  ? levothyroxine (SYNTHROID) 88 MCG tablet Daily before breakfast  ? omeprazole (PRILOSEC) 20 MG capsule Take 1 capsule (20 mg total) by mouth daily. To REPLACE Protonix  ? ondansetron (ZOFRAN-ODT) 8 MG disintegrating tablet Take 1 tablet (8 mg total) by mouth every 6 (six) hours as needed for nausea or vomiting.  ? polyethylene glycol powder (MIRALAX) 17 GM/SCOOP powder Take one capful daily if no bowel movement within past 48 hours. Can take once daily if needed.  ? Potassium Chloride ER 20 MEQ TBCR 1 tablet daily at breakfast.  ? [DISCONTINUED] escitalopram (LEXAPRO) 20 MG tablet Take 1 tablet (20 mg total) by mouth daily. (Patient taking differently: Take 20 mg by mouth daily as needed.)  ? [DISCONTINUED] levothyroxine (SYNTHROID) 100 MCG tablet TAKE ONE (1) TABLET EACH DAY  ? ?No facility-administered encounter medications on file as of 07/16/2021.  ? ?ALLERGIES: ?Allergies  ?Allergen  Reactions  ? Milk-Related Compounds Nausea And Vomiting  ?  Vomiting and sweating  ? ? ?VACCINATION STATUS: ? ?There is no immunization history on file for this patient. ? ?HPI ?Brenda [name] is 41 y.o. 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  She underwent total thyroidectomy for thyroid cancer.  She is status post Thyrogen stimulated I-131 remnant ablation with post therapy whole-body scan.  See below. ? ?-PMD:  Janora Norlander, DO.  ? ? 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 A sample was sent for Afirma molecular study which was significant for  > 99% risk of malignancy reported on Aug 03, 2019. 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 ?-She underwent I-131 thyroid remnant ablation on November 19, 2019 with whole-body scan on November 29, 2019 revealing significant uptake in the neck, inconclusive for distant metastasis. ? ?Her surveillance thyroid/neck ultrasound in April 2022 was  negative for any thyroid remnant or residual nor lymphadenopathy. ? ?Her previsit thyroid and stimulated whole-body scan is consistent with treatment effect, no evidence of recurrence or metastasis. ? ?Patient has done very well following her surgery, required calcium supplement briefly.  She remains on levothyroxine 100 mcg p.o. daily before breakfast.  ?  She reports compliance and consistency to his medication.  Her previsit labs are consistent with appropriate replacement.   ? ?She denies any family history of thyroid malignancy, however some thyroid dysfunction in a half brother. ?She denies cigarette smoking, however marijuana smoker. She denies dysphagia, shortness of breath, voice change. She denies palpitations,  tremors, nor heat/cold intolerance. She reports steady body weight. ? ?She is also on low-dose potassium supplement for mild hypokalemia. ? ?Review of Systems ?She has steady weight.  Otherwise limited as above. ? ?Objective:  ?  ? ?  07/16/2021  ?  1:21 PM 05/02/2021  ?  2:21 PM 01/17/2021  ?  9:03 AM  ?Vitals with BMI  ?Height '5\' 5"'$  '5\' 5"'$  '5\' 5"'$   ?Weight 149 lbs 147 lbs 10 oz 152 lbs  ?BMI 24.79 24.56 25.29  ?Systolic 841 324 401  ?Diastolic 82 89 85  ?Pulse 64 89 88  ? ? ?  BP 116/82   Pulse 64   Ht '5\' 5"'$  (1.651 m)   Wt 149 lb (67.6 kg)   BMI 24.79 kg/m?   ?Wt Readings from Last 3 Encounters:  ?07/16/21 149 lb (67.6 kg)  ?05/02/21 147 lb 9.6 oz (67 kg)  ?01/17/21 152 lb (68.9 kg)  ?  ?Physical Exam ? ? ?CMP ( most recent) ?CMP  ?   ?Component Value Date/Time  ? NA 137 06/22/2021 0836  ? NA 139 10/09/2020 0938  ? K 3.3 (L) 06/22/2021 0836  ? CL 107 06/22/2021 0836  ? CO2 21 (L) 06/22/2021 0836  ? GLUCOSE 100 (H) 06/22/2021 0836  ? BUN 9 06/22/2021 0836  ? BUN 9 10/09/2020 0938  ? CREATININE 0.74 06/22/2021 0836  ? CALCIUM 9.0 06/22/2021 0836  ? PROT 8.2 (H) 06/22/2021 0836  ? PROT 7.5 10/09/2020 0938  ? ALBUMIN 4.6 06/22/2021 0836  ? ALBUMIN 4.6 10/09/2020 0938  ? AST 23 06/22/2021 0836  ? ALT 20 06/22/2021 0836  ? ALKPHOS 60 06/22/2021 0836  ? BILITOT 0.9 06/22/2021 0836  ? BILITOT 0.4 10/09/2020 0938  ? GFRNONAA >60 06/22/2021 0836  ? GFRAA 122 07/02/2019 1058  ? ? ? Lipid Panel ( most recent) ?Lipid Panel  ?   ?Component Value Date/Time  ? CHOL 192 10/09/2020 0938  ? TRIG 130 10/09/2020 0938  ? HDL 42 10/09/2020 0938  ? CHOLHDL 4.6 (H) 10/09/2020 8592  ? [place] 127 (H) 10/09/2020 9244  ? LABVLDL 23 10/09/2020 0938  ? ?  ? ?Lab Results  ?Component Value Date  ? TSH 151.484 (H) 06/22/2021  ? TSH 1.390 01/03/2021  ? TSH 11.000 (H) 11/27/2020  ? TSH 3.290 10/09/2020  ? TSH 2.630 06/21/2020  ? TSH 1.410 01/06/2020  ? TSH 117.553 (H) 11/19/2019  ? TSH 3.940 10/07/2019  ? TSH 5.030 (H) 09/28/2019  ? TSH 1.960 07/02/2019   ? FREET4 1.21 (H) 06/22/2021  ? FREET4 1.33 01/03/2021  ? FREET4 1.48 11/27/2020  ? FREET4 1.88 (H) 10/09/2020  ? FREET4 1.20 06/21/2020  ? FREET4 1.51 01/06/2020  ? FREET4 1.21 (H) 11/19/2019  ? FREET4 1.72

## 2021-07-30 ENCOUNTER — Ambulatory Visit: Payer: 59 | Admitting: Gastroenterology

## 2021-07-30 ENCOUNTER — Encounter: Payer: Self-pay | Admitting: Gastroenterology

## 2021-07-30 VITALS — BP 110/80 | HR 73 | Temp 97.8°F | Ht 65.0 in | Wt 149.4 lb

## 2021-07-30 DIAGNOSIS — R1031 Right lower quadrant pain: Secondary | ICD-10-CM | POA: Diagnosis not present

## 2021-07-30 DIAGNOSIS — R197 Diarrhea, unspecified: Secondary | ICD-10-CM | POA: Diagnosis not present

## 2021-07-30 DIAGNOSIS — K219 Gastro-esophageal reflux disease without esophagitis: Secondary | ICD-10-CM | POA: Diagnosis not present

## 2021-07-30 DIAGNOSIS — G8929 Other chronic pain: Secondary | ICD-10-CM | POA: Diagnosis not present

## 2021-07-30 NOTE — Patient Instructions (Signed)
Continue Bentyl as before, if taking once daily is adequate to control symptoms, you can continue same dose. For flares, you can increase up to four times in 24 hours. Take omeprazole before supper each day to see if this controls your early morning reflux symptoms better. Send me a Pharmacist, community message in a few weeks and let me know how it works. Continue to monitor the lower abdominal pain. If symptoms persists or worsen, we would consider imaging to further evaluate. Return in one year for follow up but call sooner if any questions or concerns.

## 2021-07-30 NOTE — Progress Notes (Signed)
GI Office Note    Referring Provider: Janora Norlander, DO Primary Care Physician:  Janora Norlander, DO  Primary Gastroenterologist: Elon Alas. Abbey Chatters, DO   Chief Complaint   Chief Complaint  Patient presents with   Follow-up    Nothing new to discuss.     History of Present Illness   Brenda [name] is a 41 y.o. female presenting today for follow-up.  Patient was seen in February 2023.  She has a history of episodic lower abdominal pain associated with vomiting and diarrhea.  History of GERD as well.  At last office visit: Describes isolated episodes every couple of months with severe abdominal cramping followed by nausea vomiting and diarrhea.  Symptoms last for couple of days.  Symptoms worse in December 2022, occurring more regularly, after her younger brother committed suicide.  In between episodes BMs are anywhere from having a few stools per day, or once per week.  She is lactose intolerant, avoids milk.  Consumes cheese and very small quantities and does okay with this. She has had epigastric burning, bad heartburn and indigestion.  Had been on pantoprazole and switch to omeprazole.  She was started on dicyclomine 10 mg up to 4 times daily for abdominal pain and diarrhea.  Started on MiraLAX daily as needed to maintain regular bowel movements.  Advised to continue recently started omeprazole.  Labs completed, CRP less than 1 TTG IgA less than 2, IgA 270, sed rate 14.  Today: she is feeling better. She has cleaned up her diet. Less greasy foods. Eating healthier. Taking bentyl once per day on average. Has not had a bad episode of abdominal cramping, vomiting, diarrhea since her visit. She had one episode of diarrhea after eating out that lasted an hour but her daughter had similar symptoms as well. She denies melena, brbpr. She has only had to use miralax once recently. Otherwise stools are more regular. She is having issues with some epigastric burning and acid taste in  her mouth, worse in the mornings. She takes omeprazole before breakfast. Eats dinner at 6-7pm, snack around 8pm, and lays down around 9pm. She will take TUMS if symptoms real bad first thing in the morning.   Saw Dr. Dorris Fetch last month, had whole body scan with thyrogen to follow up on thyroid cancer, it was negative.   Her PCP does her yearly gyn exams, last one done in 09/2020.     Medications   Current Outpatient Medications  Medication Sig Dispense Refill   albuterol (VENTOLIN HFA) 108 (90 Base) MCG/ACT inhaler USE 2 PUFFS EVERY 6 HOURS AS NEEDED 8.5 g 0   Calcium Carb-Cholecalciferol (CALCIUM 500 + D PO) Take 1 tablet by mouth 2 (two) times a week.     dicyclomine (BENTYL) 10 MG capsule Take 1 capsule (10 mg total) by mouth 4 (four) times daily -  before meals and at bedtime. As needed for abdominal cramps and diarrhea 60 capsule 1   diphenoxylate-atropine (LOMOTIL) 2.5-0.025 MG tablet Take 2 tablets by mouth 4 (four) times daily as needed for diarrhea or loose stools. 30 tablet 0   levothyroxine (SYNTHROID) 88 MCG tablet Daily before breakfast 90 tablet 1   omeprazole (PRILOSEC) 20 MG capsule Take 1 capsule (20 mg total) by mouth daily. To REPLACE Protonix 90 capsule 3   ondansetron (ZOFRAN-ODT) 8 MG disintegrating tablet Take 1 tablet (8 mg total) by mouth every 6 (six) hours as needed for nausea or vomiting. 20 tablet 1  polyethylene glycol powder (MIRALAX) 17 GM/SCOOP powder Take one capful daily if no bowel movement within past 48 hours. Can take once daily if needed. 255 g 0   Potassium Chloride ER 20 MEQ TBCR 1 tablet daily at breakfast. 90 tablet 1   No current facility-administered medications for this visit.    Allergies   Allergies as of 07/30/2021 - Review Complete 07/30/2021  Allergen Reaction Noted   Milk-related compounds Nausea And Vomiting 05/02/2021    Review of Systems   General: Negative for anorexia, weight loss, fever, chills, fatigue, weakness. ENT: Negative  for hoarseness, difficulty swallowing , nasal congestion. CV: Negative for chest pain, angina, palpitations, dyspnea on exertion, peripheral edema.  Respiratory: Negative for dyspnea at rest, dyspnea on exertion, cough, sputum, wheezing.  GI: See history of present illness. GU:  Negative for dysuria, hematuria, urinary incontinence, urinary frequency, nocturnal urination.  Endo: Negative for unusual weight change.     Physical Exam   BP 110/80 (BP Location: Right Arm, Patient Position: Sitting, Cuff Size: Normal)   Pulse 73   Temp 97.8 F (36.6 C) (Temporal)   Ht '5\' 5"'$  (1.651 m)   Wt 149 lb 6.4 oz (67.8 kg)   LMP 07/09/2021 (Approximate)   SpO2 98%   BMI 24.86 kg/m    General: Well-nourished, well-developed in no acute distress.  Eyes: No icterus. Mouth: Oropharyngeal mucosa moist and pink , no lesions erythema or exudate. Lungs: Clear to auscultation bilaterally.  Heart: Regular rate and rhythm, no murmurs rubs or gallops.  Abdomen: Bowel sounds are normal, nondistended, no hepatosplenomegaly or masses,  no abdominal bruits or hernia , no rebound or guarding. Mild rlq tenderness over her appendectomy scar Rectal: not performed Extremities: No lower extremity edema. No clubbing or deformities. Neuro: Alert and oriented x 4   Skin: Warm and dry, no jaundice.   Psych: Alert and cooperative, normal mood and affect.  Labs   Lab Results  Component Value Date   CREATININE 0.74 06/22/2021   BUN 9 06/22/2021   NA 137 06/22/2021   K 3.3 (L) 06/22/2021   CL 107 06/22/2021   CO2 21 (L) 06/22/2021   Lab Results  Component Value Date   ALT 20 06/22/2021   AST 23 06/22/2021   ALKPHOS 60 06/22/2021   BILITOT 0.9 06/22/2021   Lab Results  Component Value Date   WBC 5.8 01/17/2021   HGB 14.1 01/17/2021   HCT 42.6 01/17/2021   MCV 95 01/17/2021   PLT 271 01/17/2021   Lab Results  Component Value Date   TSH 151.484 (H) 06/22/2021   Free T4 1.21 06/22/2021  Imaging Studies    No results found.  Assessment   GERD: having more nighttime/early morning symptoms. Reinforced antireflux measures. Wait at least two hours between last food intake and laying down. Take omeprazole before evening meal. If symptoms do not settle down, she would need an EGD.   Chronic RLQ pain: patient is no longer having severe episodic abdominal cramping. She has some persistent RLQ pain over her appendectomy scar which could be due to scar tissue, gyn etiology, and less likely due to IBD. Overall symptoms improved but if pain persist or worsen, would consider CT A/P with contrast. Patient aware to keep me posted on her symptoms. She will send mychart message if few weeks. She should continue to have yearly pelvic exams and is due in next couple of months with PCP.   PLAN   Switch omeprazole to before even meal  for better management of her reflux symptoms. Also should help with levothyroxine efficacy as she also has been taking these two medications about 30 minutes apart from each other. Should separate at least by 4 hours.  Continue bentyl daily. Can use up to four times daily for flares.  Monitor lower abdominal pain. If persistent or worsens, then would advise CT A/P with contrast.  She is going to send mychart message with progress report in few weeks.  Return to the office in one year or call sooner if needed.     Laureen Ochs. Bobby Rumpf, [place], [facility]

## 2021-08-21 NOTE — Telephone Encounter (Signed)
Routing to Roseanne Kaufman NP in Neil Crouch PA-C absence.

## 2021-08-24 ENCOUNTER — Other Ambulatory Visit: Payer: Self-pay | Admitting: Gastroenterology

## 2021-08-27 NOTE — Telephone Encounter (Signed)
Noted  

## 2021-09-04 ENCOUNTER — Ambulatory Visit: Payer: 59 | Admitting: Gastroenterology

## 2021-11-07 ENCOUNTER — Encounter: Payer: Self-pay | Admitting: Gastroenterology

## 2021-11-07 ENCOUNTER — Other Ambulatory Visit: Payer: Self-pay | Admitting: "Endocrinology

## 2021-11-07 ENCOUNTER — Ambulatory Visit (INDEPENDENT_AMBULATORY_CARE_PROVIDER_SITE_OTHER): Payer: 59 | Admitting: Gastroenterology

## 2021-11-07 VITALS — BP 145/95 | HR 81 | Temp 97.9°F | Ht 65.0 in | Wt 147.0 lb

## 2021-11-07 DIAGNOSIS — R932 Abnormal findings on diagnostic imaging of liver and biliary tract: Secondary | ICD-10-CM | POA: Diagnosis not present

## 2021-11-07 DIAGNOSIS — R1084 Generalized abdominal pain: Secondary | ICD-10-CM

## 2021-11-07 MED ORDER — HYDROCORTISONE (PERIANAL) 2.5 % EX CREA: 1.0000 | TOPICAL_CREAM | Freq: Two times a day (BID) | CUTANEOUS | 0 refills | Status: DC

## 2021-11-07 NOTE — Patient Instructions (Addendum)
Limit dicyclomine for abdominal cramping or loose stools. Continue MiraLAX daily as needed to maintain regular bowel movement. Continue omeprazole 20 mg daily. We will likely repeat liver imaging in a couple of months to follow-up on possible hemangioma and edema in liver noted on CT scan. I will discuss findings with Dr. Abbey Chatters and if any additional advise we will let you know. Please let me know if your abdominal pain does not resolve.  You have a thrombosed hemorrhoids. Continue sitting in hot tub of water 3 times daily for pain relief. Use RX hemorrhoid cream twice daily. Avoid straining/constipation.    Hemorrhoids Hemorrhoids are swollen veins in and around the rectum or anus. There are two types of hemorrhoids: Internal hemorrhoids. These occur in the veins that are just inside the rectum. They may poke through to the outside and become irritated and painful. External hemorrhoids. These occur in the veins that are outside the anus and can be felt as a painful swelling or hard lump near the anus. Most hemorrhoids do not cause serious problems, and they can be managed with home treatments such as diet and lifestyle changes. If home treatments do not help the symptoms, procedures can be done to shrink or remove the hemorrhoids. What are the causes? This condition is caused by increased pressure in the anal area. This pressure may result from various things, including: Constipation. Straining to have a bowel movement. Diarrhea. Pregnancy. Obesity. Sitting for long periods of time. Heavy lifting or other activity that causes you to strain. Anal sex. Riding a bike for a long period of time. What are the signs or symptoms? Symptoms of this condition include: Pain. Anal itching or irritation. Rectal bleeding. Leakage of stool (feces). Anal swelling. One or more lumps around the anus. How is this diagnosed? This condition can often be diagnosed through a visual exam. Other exams or  tests may also be done, such as: An exam that involves feeling the rectal area with a gloved hand (digital rectal exam). An exam of the anal canal that is done using a small tube (anoscope). A blood test, if you have lost a significant amount of blood. A test to look inside the colon using a flexible tube with a camera on the end (sigmoidoscopy or colonoscopy). How is this treated? This condition can usually be treated at home. However, various procedures may be done if dietary changes, lifestyle changes, and other home treatments do not help your symptoms. These procedures can help make the hemorrhoids smaller or remove them completely. Some of these procedures involve surgery, and others do not. Common procedures include: Rubber band ligation. Rubber bands are placed at the base of the hemorrhoids to cut off their blood supply. Sclerotherapy. Medicine is injected into the hemorrhoids to shrink them. Infrared coagulation. A type of light energy is used to get rid of the hemorrhoids. Hemorrhoidectomy surgery. The hemorrhoids are surgically removed, and the veins that supply them are tied off. Stapled hemorrhoidopexy surgery. The surgeon staples the base of the hemorrhoid to the rectal wall. Follow these instructions at home: Eating and drinking  Eat foods that have a lot of fiber in them, such as whole grains, beans, nuts, fruits, and vegetables. Ask your health care provider about taking products that have added fiber (fiber supplements). Reduce the amount of fat in your diet. You can do this by eating low-fat dairy products, eating less red meat, and avoiding processed foods. Drink enough fluid to keep your urine pale yellow. Managing pain  and swelling  Take warm sitz baths for 20 minutes, 3-4 times a day to ease pain and discomfort. You may do this in a bathtub or using a portable sitz bath that fits over the toilet. If directed, apply ice to the affected area. Using ice packs between sitz  baths may be helpful. Put ice in a plastic bag. Place a towel between your skin and the bag. Leave the ice on for 20 minutes, 2-3 times a day. General instructions Take over-the-counter and prescription medicines only as told by your health care provider. Use medicated creams or suppositories as told. Get regular exercise. Ask your health care provider how much and what kind of exercise is best for you. In general, you should do moderate exercise for at least 30 minutes on most days of the week (150 minutes each week). This can include activities such as walking, biking, or yoga. Go to the bathroom when you have the urge to have a bowel movement. Do not wait. Avoid straining to have bowel movements. Keep the anal area dry and clean. Use wet toilet paper or moist towelettes after a bowel movement. Do not sit on the toilet for long periods of time. This increases blood pooling and pain. Keep all follow-up visits as told by your health care provider. This is important. Contact a health care provider if you have: Increasing pain and swelling that are not controlled by treatment or medicine. Difficulty having a bowel movement, or you are unable to have a bowel movement. Pain or inflammation outside the area of the hemorrhoids. Get help right away if you have: Uncontrolled bleeding from your rectum. Summary Hemorrhoids are swollen veins in and around the rectum or anus. Most hemorrhoids can be managed with home treatments such as diet and lifestyle changes. Taking warm sitz baths can help ease pain and discomfort. In severe cases, procedures or surgery can be done to shrink or remove the hemorrhoids. This information is not intended to replace advice given to you by your health care provider. Make sure you discuss any questions you have with your health care provider. Document Revised: 09/06/2020 Document Reviewed: 09/06/2020 Elsevier Patient Education  [place].

## 2021-11-07 NOTE — Progress Notes (Signed)
GI Office Note    Referring Provider: Janora Norlander, DO Primary Care Physician:  Janora Norlander, DO  Primary Gastroenterologist: Elon Alas. Abbey Chatters, DO   Chief Complaint   Chief Complaint  Patient presents with   abdnormal imaging    History of Present Illness   Brenda [name] is a 41 y.o. female presenting today  for follow up abnormal imaging during recent ED visit. She was last seen in the office in 07/2021.  She has a history of episodic lower abdominal pain associated with vomiting and diarrhea.  History of GERD.  Patient had been doing well with exception of occasional mild symptoms of diarrhea every couple of months. Possibly due to stress. Last full episode with abdominal pain, vomiting and diarrhea had been 8 months ago.   Recently had another episode of acute onset stomach pain. Started with vomiting. Vomited for hours and unable to keep anything down. No diarrhea this time. She reports that she had been moving her bowels well before that. No melena, brbpr. No changes in diet. Abdominal pain woke her up from sleep. Went to the ED.   Labs while in the ED on October 27, 2021: Urine drug screen positive for cannabinoids, lipase 11, white blood cell count 16,200, hemoglobin 14.4, platelets 240,000, creatinine 0.5, glucose 153, albumin 4.5, total bilirubin 0.74, alkaline phosphatase 87, AST 26, ALT 27.  CT abdomen pelvis with contrast October 27, 2021 showed small hypoattenuating lesion in the anterior segment indeterminate but may represent a cyst or hemangioma.  Periportal edema.  At baseline takes Miralax every other day. Bentyl one per day. Lomotil only when needed but none recently.  Omeprazole daily. Intermittent marijuana use anywhere from 1-2 times per month to 1-2 times per week. No etoh.   Feeling better now. Starting to resume regular diet. Abdominal pain much better but still sore. BMs normal. She thinks she has a hemorrhoid. Associated with pain, itching.  Using otc hemorrhoid creams.   No egd/tcs      Medications   Current Outpatient Medications  Medication Sig Dispense Refill   albuterol (VENTOLIN HFA) 108 (90 Base) MCG/ACT inhaler USE 2 PUFFS EVERY 6 HOURS AS NEEDED 8.5 g 0   Calcium Carb-Cholecalciferol (CALCIUM 500 + D PO) Take 1 tablet by mouth 2 (two) times a week.     dicyclomine (BENTYL) 10 MG capsule TAKE ONE (1) CAPSULE 4 TIMES DAILY BEFORE MEALS AND AT BEDTIME 60 capsule 1   diphenoxylate-atropine (LOMOTIL) 2.5-0.025 MG tablet Take 2 tablets by mouth 4 (four) times daily as needed for diarrhea or loose stools. 30 tablet 0   levothyroxine (SYNTHROID) 88 MCG tablet TAKE 1 TABLET BY MOUTH EVERY MORNING BEFORE BREAKFAST 90 tablet 1   omeprazole (PRILOSEC) 20 MG capsule Take 1 capsule (20 mg total) by mouth daily. To REPLACE Protonix 90 capsule 3   ondansetron (ZOFRAN-ODT) 8 MG disintegrating tablet Take 1 tablet (8 mg total) by mouth every 6 (six) hours as needed for nausea or vomiting. 20 tablet 1   polyethylene glycol powder (MIRALAX) 17 GM/SCOOP powder Take one capful daily if no bowel movement within past 48 hours. Can take once daily if needed. 255 g 0   Potassium Chloride ER 20 MEQ TBCR 1 tablet daily at breakfast. (Patient taking differently: 1 tablet a couple times a week) 90 tablet 1   promethazine (PHENERGAN) 25 MG suppository Place rectally.     No current facility-administered medications for this visit.    Allergies  Allergies as of 11/07/2021 - Review Complete 11/07/2021  Allergen Reaction Noted   Milk-related compounds Nausea And Vomiting 05/02/2021       Review of Systems   General: Negative for anorexia, weight loss, fever, chills, fatigue, weakness. ENT: Negative for hoarseness, difficulty swallowing , nasal congestion. CV: Negative for chest pain, angina, palpitations, dyspnea on exertion, peripheral edema.  Respiratory: Negative for dyspnea at rest, dyspnea on exertion, cough, sputum, wheezing.  GI:  See history of present illness. GU:  Negative for dysuria, hematuria, urinary incontinence, urinary frequency, nocturnal urination.  Endo: Negative for unusual weight change.     Physical Exam   BP (!) 145/95 (BP Location: Left Arm, Patient Position: Sitting, Cuff Size: Normal)   Pulse 81   Temp 97.9 F (36.6 C) (Temporal)   Ht '5\' 5"'$  (1.651 m)   Wt 147 lb (66.7 kg)   LMP 10/05/2021 (Approximate)   SpO2 99%   BMI 24.46 kg/m    General: Well-nourished, well-developed in no acute distress.  Eyes: No icterus. Mouth: Oropharyngeal mucosa moist and pink , no lesions erythema or exudate. Lungs: Clear to auscultation bilaterally.  Heart: Regular rate and rhythm, no murmurs rubs or gallops.  Abdomen: Bowel sounds are normal, nontender, nondistended, no hepatosplenomegaly or masses,  no abdominal bruits or hernia , no rebound or guarding.  Rectal: thrombosed hemorrhoid noted externally, tender to palpation limiting internal examination due to pain Extremities: No lower extremity edema. No clubbing or deformities. Neuro: Alert and oriented x 4   Skin: Warm and dry, no jaundice.   Psych: Alert and cooperative, normal mood and affect.  Labs   See hpi  Imaging Studies   No results found.  Assessment   Generalized abdominal pain associated with vomiting: symptoms had settled down over the past 8 months until recent episode. Typically also associated with diarrhea but was not this time. CT with incidental liver lesion and periportal edema (unclear significance). Symptoms are much improved at this time. It is not clear what provoked this episode. She was on vacation but did not feel like her diet was any different than usual. Still having some abdominal soreness but appetite returning. Uses marijuana intermittently but presentation is no typical of hyperemesis cannabinoid syndrome.  Thrombosed hemorrhoid: present for nearly 72 hours. Somewhat improved. At this point would recommend  conservative measures as she has reached beyond the 24-48 hours that surgical intervention would be advised.   GERD: well controlled.   PLAN    Limit dicyclomine for abdominal cramping or loose stools. Avoid constipation. Continue MiraLAX daily as needed to maintain regular bowel movement. Continue omeprazole 20 mg daily. We will likely repeat liver imaging in a couple of months to follow-up on possible hemangioma and edema in liver noted on CT scan. I will discuss findings with Dr. Abbey Chatters and if any additional advise we will let you know. Please let me know if your abdominal pain does not resolve.  You have a thrombosed hemorrhoids. Continue sitting in hot tub of water 3 times daily for pain relief. Use RX hemorrhoid cream twice daily. Avoid straining/constipation.    Laureen Ochs. Bobby Rumpf, [place], [facility]

## 2021-11-15 ENCOUNTER — Encounter: Payer: Self-pay | Admitting: Family Medicine

## 2021-12-14 ENCOUNTER — Other Ambulatory Visit: Payer: Self-pay | Admitting: Family Medicine

## 2022-01-02 ENCOUNTER — Ambulatory Visit (INDEPENDENT_AMBULATORY_CARE_PROVIDER_SITE_OTHER): Payer: 59 | Admitting: Gastroenterology

## 2022-01-02 ENCOUNTER — Encounter: Payer: Self-pay | Admitting: Gastroenterology

## 2022-01-02 ENCOUNTER — Other Ambulatory Visit: Payer: BC Managed Care – PPO

## 2022-01-02 VITALS — BP 154/88 | HR 72 | Temp 97.9°F | Ht 65.0 in | Wt 148.0 lb

## 2022-01-02 DIAGNOSIS — E876 Hypokalemia: Secondary | ICD-10-CM | POA: Diagnosis not present

## 2022-01-02 DIAGNOSIS — R1084 Generalized abdominal pain: Secondary | ICD-10-CM

## 2022-01-02 DIAGNOSIS — E890 Postprocedural hypothyroidism: Secondary | ICD-10-CM | POA: Diagnosis not present

## 2022-01-02 DIAGNOSIS — R932 Abnormal findings on diagnostic imaging of liver and biliary tract: Secondary | ICD-10-CM

## 2022-01-02 DIAGNOSIS — K219 Gastro-esophageal reflux disease without esophagitis: Secondary | ICD-10-CM

## 2022-01-02 NOTE — Patient Instructions (Signed)
CT abdomen/pelvis as planned.

## 2022-01-02 NOTE — Progress Notes (Signed)
GI Office Note    Referring Provider: Janora Norlander, DO Primary Care Physician:  Janora Norlander, DO  Primary Gastroenterologist: Elon Alas. Abbey Chatters, DO   Chief Complaint   Chief Complaint  Patient presents with   Follow-up    Following up on CT and also states that she is having some abdominal and chest discomfort.    History of Present Illness   Brenda [name] is a 41 y.o. female presenting today for follow up. Last seen in 10/2021.   History of generalized abdominal pain associated with vomiting, intermittent episodes.  Can go months at a time with the symptoms.  Typically associated with diarrhea.  BM regular for most part with intermittent diarrhea. No bad abdominal pain recently. More or less aggravating pain. Complains of intermittent epigastric pain/chest pain. She uses bentyl at least one per day. If has episode of pain, then will take second dose. Seems to help a little. No melena, brbpr. No heartburn, dysphagia. No further hemorrhoids issues.   Labs while in the ED on October 27, 2021: Urine drug screen positive for cannabinoids, lipase 11, white blood cell count 16,200, hemoglobin 14.4, platelets 240,000, creatinine 0.5, glucose 153, albumin 4.5, total bilirubin 0.74, alkaline phosphatase 87, AST 26, ALT 27.   CT abdomen pelvis with contrast October 27, 2021 showed small hypoattenuating lesion in the anterior segment indeterminate but may represent a cyst or hemangioma.  Periportal edema.   Medications   Current Outpatient Medications  Medication Sig Dispense Refill   albuterol (VENTOLIN HFA) 108 (90 Base) MCG/ACT inhaler USE 2 PUFFS EVERY 6 HOURS AS NEEDED 8.5 g 0   Calcium Carb-Cholecalciferol (CALCIUM 500 + D PO) Take 1 tablet by mouth 2 (two) times a week.     dicyclomine (BENTYL) 10 MG capsule TAKE ONE (1) CAPSULE 4 TIMES DAILY BEFORE MEALS AND AT BEDTIME 60 capsule 1   diphenoxylate-atropine (LOMOTIL) 2.5-0.025 MG tablet Take 2 tablets by mouth 4  (four) times daily as needed for diarrhea or loose stools. 30 tablet 0   hydrocortisone (ANUSOL-HC) 2.5 % rectal cream Place 1 Application rectally 2 (two) times daily. 30 g 0   levothyroxine (SYNTHROID) 88 MCG tablet TAKE 1 TABLET BY MOUTH EVERY MORNING BEFORE BREAKFAST 90 tablet 1   omeprazole (PRILOSEC) 20 MG capsule Take 1 capsule (20 mg total) by mouth daily. To REPLACE Protonix 90 capsule 3   ondansetron (ZOFRAN-ODT) 8 MG disintegrating tablet Take 1 tablet (8 mg total) by mouth every 6 (six) hours as needed for nausea or vomiting. 20 tablet 1   polyethylene glycol powder (MIRALAX) 17 GM/SCOOP powder Take one capful daily if no bowel movement within past 48 hours. Can take once daily if needed. 255 g 0   Potassium Chloride ER 20 MEQ TBCR 1 tablet daily at breakfast. (Patient taking differently: 1 tablet a couple times a week) 90 tablet 1   promethazine (PHENERGAN) 25 MG suppository Place rectally.     No current facility-administered medications for this visit.    Allergies   Allergies as of 01/02/2022 - Review Complete 01/02/2022  Allergen Reaction Noted   Milk-related compounds Nausea And Vomiting 05/02/2021      Review of Systems   General: Negative for anorexia, weight loss, fever, chills, fatigue, weakness. ENT: Negative for hoarseness, difficulty swallowing , nasal congestion. CV: Negative for chest pain, angina, palpitations, dyspnea on exertion, peripheral edema.  Respiratory: Negative for dyspnea at rest, dyspnea on exertion, cough, sputum, wheezing.  GI: See  history of present illness. GU:  Negative for dysuria, hematuria, urinary incontinence, urinary frequency, nocturnal urination.  Endo: Negative for unusual weight change.     Physical Exam   BP (!) 154/88 (BP Location: Right Arm, Patient Position: Sitting, Cuff Size: Normal)   Pulse 72   Temp 97.9 F (36.6 C) (Oral)   Ht '5\' 5"'$  (1.651 m)   Wt 148 lb (67.1 kg)   LMP 12/31/2021   SpO2 100%   BMI 24.63 kg/m     General: Well-nourished, well-developed in no acute distress.  Eyes: No icterus. Mouth: Oropharyngeal mucosa moist and pink   Abdomen: Bowel sounds are normal, nontender, nondistended, no hepatosplenomegaly or masses,  no abdominal bruits or hernia , no rebound or guarding.  Rectal: not performed Extremities: No lower extremity edema. No clubbing or deformities. Neuro: Alert and oriented x 4   Skin: Warm and dry, no jaundice.   Psych: Alert and cooperative, normal mood and affect.  Labs   Lab Results  Component Value Date   CREATININE 0.76 01/02/2022   BUN 8 01/02/2022   NA 139 01/02/2022   K 4.0 01/02/2022   CL 103 01/02/2022   CO2 23 01/02/2022   Lab Results  Component Value Date   ALT 15 01/02/2022   AST 17 01/02/2022   ALKPHOS 71 01/02/2022   BILITOT 0.2 01/02/2022   Lab Results  Component Value Date   WBC 5.8 01/17/2021   HGB 14.1 01/17/2021   HCT 42.6 01/17/2021   MCV 95 01/17/2021   PLT 271 01/17/2021  . Imaging Studies   No results found.  Assessment   GERD: well controlled  Generalized abdominal pain: intermittent, associated with vomiting and diarrhea. Overall symptoms improved as far as severe pain. Continues to have nagging pain in epigastric region/chest at times. Possible GERD, dyspepsia vs gastritis. Previous CT with abnormal liver findings, therefore plans for repeat imaging in the near future.   Abnormal liver on CT: small indeterminate liver lesion and periportal edema   PLAN   CT A/P with contrast. Continue omeprazole and bentyl as before.    Laureen Ochs. Bobby Rumpf, [place], [facility]

## 2022-01-03 ENCOUNTER — Telehealth: Payer: Self-pay | Admitting: *Deleted

## 2022-01-03 NOTE — Telephone Encounter (Signed)
Pt states that [place] is too far. She would prefer to go to [facility] even it's scheduled into next month or December. Will call and schedule CT

## 2022-01-03 NOTE — Telephone Encounter (Signed)
LM with pt's son to have her give me a call back to see if she would like to go to [place] to have CT done.

## 2022-01-04 ENCOUNTER — Encounter: Payer: Self-pay | Admitting: *Deleted

## 2022-01-04 NOTE — Telephone Encounter (Signed)
CT scheduled for 01/09/22 at 2 pm, arrive at 1:30 pm at [address] Pt will need to go pick up contrast at that location.   Tried calling home # but no answer, called cell # and LM with her son to have her give office a call

## 2022-01-04 NOTE — Telephone Encounter (Signed)
Pt informed of procedure date, time and to pick up prep. Info also sent to pt's MyChart per pt's request.

## 2022-01-07 ENCOUNTER — Other Ambulatory Visit: Payer: Self-pay

## 2022-01-07 ENCOUNTER — Encounter: Payer: Self-pay | Admitting: Gastroenterology

## 2022-01-09 ENCOUNTER — Ambulatory Visit
Admission: RE | Admit: 2022-01-09 | Discharge: 2022-01-09 | Disposition: A | Discharge status: Home / Self Care | Payer: BC Managed Care – PPO | Source: Ambulatory Visit | Attending: Gastroenterology | Admitting: Gastroenterology

## 2022-01-09 DIAGNOSIS — R932 Abnormal findings on diagnostic imaging of liver and biliary tract: Secondary | ICD-10-CM

## 2022-01-09 DIAGNOSIS — R1110 Vomiting, unspecified: Secondary | ICD-10-CM | POA: Diagnosis not present

## 2022-01-09 DIAGNOSIS — K769 Liver disease, unspecified: Secondary | ICD-10-CM | POA: Diagnosis not present

## 2022-01-09 DIAGNOSIS — R1084 Generalized abdominal pain: Secondary | ICD-10-CM

## 2022-01-09 MED ORDER — IOPAMIDOL (ISOVUE-300) INJECTION 61%
100.0000 mL | Freq: Once | INTRAVENOUS | Status: AC | PRN
  Administered 2022-01-09: 100 mL via INTRAVENOUS

## 2022-01-11 LAB — THYROGLOBULIN LEVEL: Thyroglobulin (TG-RIA): <2 ng/mL

## 2022-01-11 LAB — COMPREHENSIVE METABOLIC PANEL
ALT: 15 IU/L (ref 0–32)
AST: 17 IU/L (ref 0–40)
Albumin/Globulin Ratio: 1.6 (ref 1.2–2.2)
Albumin: 4.4 g/dL (ref 3.9–4.9)
Alkaline Phosphatase: 71 IU/L (ref 44–121)
BUN/Creatinine Ratio: 11 (ref 9–23)
BUN: 8 mg/dL (ref 6–24)
Bilirubin Total: 0.2 mg/dL (ref 0.0–1.2)
CO2: 23 mmol/L (ref 20–29)
Calcium: 9.3 mg/dL (ref 8.7–10.2)
Chloride: 103 mmol/L (ref 96–106)
Creatinine, Ser: 0.76 mg/dL (ref 0.57–1.00)
Globulin, Total: 2.7 g/dL (ref 1.5–4.5)
Glucose: 90 mg/dL (ref 70–99)
Potassium: 4 mmol/L (ref 3.5–5.2)
Sodium: 139 mmol/L (ref 134–144)
Total Protein: 7.1 g/dL (ref 6.0–8.5)
eGFR: 102 mL/min/{1.73_m2} (ref >59)

## 2022-01-11 LAB — TSH: TSH: 4.57 u[IU]/mL — ABNORMAL HIGH (ref 0.450–4.500)

## 2022-01-11 LAB — T4, FREE: Free T4: 1.38 ng/dL (ref 0.82–1.77)

## 2022-01-16 ENCOUNTER — Ambulatory Visit (INDEPENDENT_AMBULATORY_CARE_PROVIDER_SITE_OTHER): Payer: BC Managed Care – PPO | Admitting: "Endocrinology

## 2022-01-16 ENCOUNTER — Encounter: Payer: Self-pay | Admitting: "Endocrinology

## 2022-01-16 VITALS — BP 120/82 | HR 68 | Ht 65.0 in | Wt 146.4 lb

## 2022-01-16 DIAGNOSIS — E890 Postprocedural hypothyroidism: Secondary | ICD-10-CM | POA: Diagnosis not present

## 2022-01-16 DIAGNOSIS — C73 Malignant neoplasm of thyroid gland: Secondary | ICD-10-CM | POA: Diagnosis not present

## 2022-01-16 DIAGNOSIS — E876 Hypokalemia: Secondary | ICD-10-CM

## 2022-01-16 MED ORDER — LEVOTHYROXINE SODIUM 100 MCG PO TABS: ORAL_TABLET | ORAL | 1 refills | Status: DC

## 2022-01-16 NOTE — Progress Notes (Signed)
01/16/2022, 12:36 PM      Endocrinology follow-up note   Subjective:    Patient ID: Brenda [name], female    DOB: 27-Mar-1980, PCP Janora Norlander, DO   Past Medical History:  Diagnosis Date   Anxiety    Malignant neoplasm of thyroid gland ([place]) 09/30/2019   Past Surgical History:  Procedure Laterality Date   APPENDECTOMY     THYROIDECTOMY Left 09/14/2019   Procedure: LEFT HEMITHYROIDECTOMY;  Surgeon: Leta Baptist, MD;  Location: [place];  Service: ENT;  Laterality: Left;   Social History   Socioeconomic History   Marital status: Married    Spouse name: [place]   Number of children: 2   Years of education: some college   Highest education level: Not on file  Occupational History    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 occasional marijuana (once weekly)   Sexual activity: Yes    Birth control/protection: Surgical    Comment: tubal  Other Topics Concern   Not on file  Social History Narrative   Not on file   Social Determinants of Health   Financial Resource Strain: Not on file  Food Insecurity: Not on file  Transportation Needs: Not on file  Physical Activity: Not on file  Stress: Not on file  Social Connections: Not on file   Family History  Problem Relation Age of Onset   Anxiety disorder Mother    Asthma Mother    COPD Mother    Hypertension Mother    Hyperlipidemia Mother    Osteoporosis Mother    Diabetes Mother    Irritable bowel syndrome Mother    Heart disease Father    Heart attack Father    Bipolar disorder Sister    Schizophrenia Sister    Drug abuse Sister    Drug abuse Brother    Anxiety disorder  Brother    Depression Brother    Thyroid disease Brother    Hyperlipidemia Maternal Grandmother    Hypertension Maternal Grandmother    Stroke Maternal Grandmother    Alzheimer's disease Maternal Grandmother    Cancer Maternal Grandfather        lung   Heart disease Maternal Grandfather    Kidney disease Paternal Grandmother    Cancer Paternal Grandfather    Asthma Son    Colon cancer Neg Hx    Inflammatory bowel disease Neg Hx    Celiac disease Neg Hx    Outpatient Encounter Medications as of 01/16/2022  Medication Sig   diphenoxylate-atropine (LOMOTIL) 2.5-0.025 MG tablet Take 2 tablets by mouth 4 (four) times daily as needed for diarrhea or loose stools.   albuterol (VENTOLIN HFA) 108 (90 Base) MCG/ACT inhaler USE 2 PUFFS EVERY 6 HOURS AS NEEDED   dicyclomine (BENTYL) 10 MG capsule TAKE ONE (1) CAPSULE 4 TIMES DAILY BEFORE MEALS AND AT BEDTIME  levothyroxine (SYNTHROID) 100 MCG tablet TAKE 1 TABLET BY MOUTH EVERY MORNING BEFORE BREAKFAST   omeprazole (PRILOSEC) 20 MG capsule Take 1 capsule (20 mg total) by mouth daily. To REPLACE Protonix   ondansetron (ZOFRAN-ODT) 8 MG disintegrating tablet Take 1 tablet (8 mg total) by mouth every 6 (six) hours as needed for nausea or vomiting.   polyethylene glycol powder (MIRALAX) 17 GM/SCOOP powder Take one capful daily if no bowel movement within past 48 hours. Can take once daily if needed.   Potassium Chloride ER 20 MEQ TBCR 1 tablet daily at breakfast. (Patient not taking: Reported on 01/16/2022)   promethazine (PHENERGAN) 25 MG suppository Place rectally.   [DISCONTINUED] Calcium Carb-Cholecalciferol (CALCIUM 500 + D PO) Take 1 tablet by mouth 2 (two) times a week.   [DISCONTINUED] hydrocortisone (ANUSOL-HC) 2.5 % rectal cream Place 1 Application rectally 2 (two) times daily. (Patient not taking: Reported on 01/02/2022)   [DISCONTINUED] levothyroxine (SYNTHROID) 88 MCG tablet TAKE 1 TABLET BY MOUTH EVERY MORNING BEFORE BREAKFAST   No  facility-administered encounter medications on file as of 01/16/2022.   ALLERGIES: Allergies  Allergen Reactions   Milk-Related Compounds Nausea And Vomiting    Vomiting and sweating    VACCINATION STATUS:  There is no immunization history on file for this patient.  HPI Brenda [name] is 41 y.o. female who presents today to follow-up for postsurgical hypothyroidism and history of thyroid malignancy.  She underwent total thyroidectomy for thyroid cancer.  She is status post Thyrogen stimulated I-131 remnant ablation with post therapy whole-body scan.  See below.  -PMD:  Janora Norlander, DO.    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 A sample was sent for Afirma molecular study which was significant for  > 99% risk of malignancy reported on Aug 03, 2019. 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 -She underwent I-131 thyroid remnant ablation on November 19, 2019 with whole-body scan on November 29, 2019 revealing significant uptake in the neck, inconclusive for distant metastasis.  Her surveillance thyroid/neck ultrasound in April 2022 was  negative for any thyroid remnant or residual nor lymphadenopathy.  Her previsit thyroid and stimulated whole-body scan is consistent with treatment effect, no evidence of recurrence or metastasis.   Patient has done very well following her surgery, required calcium supplement briefly.  She remains on levothyroxine 88 mcg p.o. daily before breakfast.  Her previsit thyroid function tests are consistent with slight under replacement.   She reports compliance and consistency to his medication.  Her previsit labs are consistent with appropriate replacement.    She denies any  family history of thyroid malignancy, however some thyroid dysfunction in a half brother. She denies cigarette smoking, however marijuana smoker. She denies dysphagia, shortness of breath, voice change. She denies palpitations, tremors, nor heat/cold intolerance. She reports steady body weight.  She is also on low-dose potassium supplement for mild hypokalemia.  Review of Systems She has steady weight.  Otherwise limited as above.  Objective:       01/16/2022    9:54 AM 01/02/2022    2:10 PM 11/07/2021   12:58 PM  Vitals with BMI  Height _0  _1  _2   Weight 146 lbs 6 oz 148 lbs  147 lbs  BMI 24.36 86.57 84.69  Systolic 629 528 413  Diastolic 82 88 95  Pulse 68 72 81    BP 120/82   Pulse 68   Ht _0  (1.651 m)   Wt 146 lb 6.4 oz (66.4 kg)   LMP 12/31/2021   BMI 24.36 kg/m   Wt Readings from Last 3 Encounters:  01/16/22 146 lb 6.4 oz (66.4 kg)  01/02/22 148 lb (67.1 kg)  11/07/21 147 lb (66.7 kg)    Physical Exam   CMP ( most recent) CMP     Component Value Date/Time   NA 139 01/02/2022 1257   K 4.0 01/02/2022 1257   CL 103 01/02/2022 1257   CO2 23 01/02/2022 1257   GLUCOSE 90 01/02/2022 1257   GLUCOSE 100 (H) 06/22/2021 0836   BUN 8 01/02/2022 1257   CREATININE 0.76 01/02/2022 1257   CALCIUM 9.3 01/02/2022 1257   PROT 7.1 01/02/2022 1257   ALBUMIN 4.4 01/02/2022 1257   AST 17 01/02/2022 1257   ALT 15 01/02/2022 1257   ALKPHOS 71 01/02/2022 1257   BILITOT 0.2 01/02/2022 1257   GFRNONAA >60 06/22/2021 0836   GFRAA 122 07/02/2019 1058     Lipid Panel ( most recent) Lipid Panel     Component Value Date/Time   CHOL 192 10/09/2020 0938   TRIG 130 10/09/2020 0938   HDL 42 10/09/2020 0938   CHOLHDL 4.6 (H) 10/09/2020 0938   LDLCALC 127 (H) 10/09/2020 0938   LABVLDL 23 10/09/2020 0938      Lab Results  Component Value Date   TSH 4.570 (H) 01/02/2022   TSH 151.484 (H) 06/22/2021   TSH 1.390 01/03/2021   TSH 11.000 (H) 11/27/2020   TSH 3.290  10/09/2020   TSH 2.630 06/21/2020   TSH 1.410 01/06/2020   TSH 117.553 (H) 11/19/2019   TSH 3.940 10/07/2019   TSH 5.030 (H) 09/28/2019   FREET4 1.38 01/02/2022   FREET4 1.21 (H) 06/22/2021   FREET4 1.33 01/03/2021   FREET4 1.48 11/27/2020   FREET4 1.88 (H) 10/09/2020   FREET4 1.20 06/21/2020   FREET4 1.51 01/06/2020   FREET4 1.21 (H) 11/19/2019   FREET4 1.72 10/07/2019   FREET4 1.06 09/28/2019      Assessment & Plan:   1. Postsurgical hypothyroidism 2. Malignant neoplasm of thyroid gland ([place]) -I reviewed  her current and existing labs and imaging studies with her. - I have reviewed her available thyroid records and clinically evaluated the patient.  Reviewed her most recent thyroid testing with whole-body scan as well as her labs. 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   Pathologic Stage Classification (pTNM, AJCC 8th Edition): pT2, pN not  assigned (no nodes submitted or found)   -In light of her follicular variant PTC, stage 2 cancer, relative youth of the patient, she was offered  adjuvant therapy with I-131 thyroid remnant ablation. This treatment was administered on November 19, 2019.  Post therapy whole-body scan results are consistent with significant uptake in the neck, inconclusive for distant metastases-nondiagnostic due to the degree of uptake of tracer at the neck, despite shielding.     -Her surveillance   thyroid/neck ultrasound in April 2022 was negative for any remnant or residual nor lymphadenopathy.     She  has recovered well from her surgery.  Her previsit thyroid stimulated whole-body scan completed on June 25, 2021 is consistent with treatment effect, no evidence of distant metastasis or recurrence.   She will be considered for surveillance thyroid/neck ultrasound  after her next visit.   Her previsit thyroid function tests are consistent with slight under replacement.  I discussed and increase her levothyroxine to 100 mcg p.o. daily before breakfast.     - We discussed about the correct intake of her thyroid hormone, on empty stomach at fasting, with water, separated by at least 30 minutes from breakfast and other medications,  and separated by more than 4 hours from calcium, iron, multivitamins, acid reflux medications (PPIs). -Patient is made aware of the fact that thyroid hormone replacement is needed for life, dose to be adjusted by periodic monitoring of thyroid function tests.  She has not taken calcium supplements since her last visit.  Her previsit labs show calcium of 9.3.  She took herself off of the Os-Cal.  Her CMP also shows normalization of potassium at 4.0 status post supplement with potassium chloride.   - she is advised to maintain close follow up with Janora Norlander, DO for primary care needs.    I spent 31 minutes in the care of the patient today including review of labs from Thyroid Function, CMP, and other relevant labs ; imaging/biopsy records (current and previous including abstractions from other facilities); face-to-face time discussing  her lab results and symptoms, medications doses, her options of short and long term treatment based on the latest standards of care / guidelines;   and documenting the encounter.  Leslie D Licausi  participated in the discussions, expressed understanding, and voiced agreement with the above plans.  All questions were answered to her satisfaction. she is encouraged to contact clinic should she have any questions or concerns prior to her return visit.   Follow up plan: Return in about 6 months (around 07/17/2022) for F/U with Pre-visit Labs.   Glade Lloyd, MD [facility] [address] [place], [place] [address] Phone: [phone]  Fax:  [phone]     01/16/2022, 12:36 PM  This note was partially dictated with voice recognition software. Similar sounding words can be transcribed inadequately or may not  be corrected upon review.

## 2022-03-01 ENCOUNTER — Other Ambulatory Visit: Payer: Self-pay | Admitting: Gastroenterology

## 2022-04-15 IMAGING — NM NM RAI THYROID CANCER W/ THYROGEN
1 series · 1 of 1 positions shown · non-contrast
Comparison: none

CLINICAL DATA: Thyroid cancer post thyroidectomy, remnant ablation

[Series 1: bone statics · 2.07mm/px · 1 of 1 slices shown]
[im 1/1]
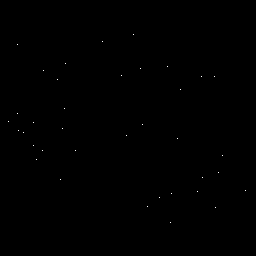

[1 of 1 positions shown; findings below may reference images not displayed]

EXAM:
RADIOACTIVE IODINE THERAPY FOR THYROID CANCER

PROCEDURE:
The risks and benefits of radioactive iodine therapy were discussed
with the patient in detail. Alternative therapies were also
mentioned. Radiation safety was discussed with the patient,
including how to protect the general public from exposure. There
were no barriers to communication. Written consent was obtained. The
patient then received a capsule containing the radiopharmaceutical.
Patient will return in 10 days for posttherapy whole-body imaging.

The patient will follow-up with the referring physician.

RADIOPHARMACEUTICALS:  50.0 mCi O-5S5 sodium iodide orally
FINDINGS: As above
IMPRESSION: Per oral administration of O-5S5 sodium iodide for thyroid remnant
ablation / thyroid cancer adjuvant therapy.

## 2022-05-17 ENCOUNTER — Ambulatory Visit: Payer: 59 | Admitting: Family Medicine

## 2022-05-17 ENCOUNTER — Encounter: Payer: Self-pay | Admitting: Family Medicine

## 2022-05-17 VITALS — BP 127/79 | HR 64 | Temp 98.8°F | Ht 65.0 in | Wt 145.2 lb

## 2022-05-17 DIAGNOSIS — F320 Major depressive disorder, single episode, mild: Secondary | ICD-10-CM

## 2022-05-17 DIAGNOSIS — H0266 Xanthelasma of left eye, unspecified eyelid: Secondary | ICD-10-CM | POA: Diagnosis not present

## 2022-05-17 DIAGNOSIS — H0263 Xanthelasma of right eye, unspecified eyelid: Secondary | ICD-10-CM

## 2022-05-17 DIAGNOSIS — R5382 Chronic fatigue, unspecified: Secondary | ICD-10-CM

## 2022-05-17 DIAGNOSIS — F411 Generalized anxiety disorder: Secondary | ICD-10-CM | POA: Diagnosis not present

## 2022-05-17 DIAGNOSIS — R61 Generalized hyperhidrosis: Secondary | ICD-10-CM

## 2022-05-17 MED ORDER — DESVENLAFAXINE SUCCINATE ER 50 MG PO TB24: 50.0000 mg | ORAL_TABLET | Freq: Every day | ORAL | 0 refills | Status: DC

## 2022-05-17 NOTE — Progress Notes (Signed)
Subjective: CC:hot flashes/ eye spots PCP: Janora Norlander, DO [id number] D Brenda is a 42 y.o. female presenting to clinic today for:  1.  Spots on eyes Patient recently had an optometry visit and was noted to have some deposits on the eyelids.  They recommended that she get her cholesterol checked.  She notes that these have been occurring over the last several months.  Does not report any pain.  She does not really follow a specific diet.  This is not affecting her vision  2.  Night sweats, fatigue Patient continues to suffer from night sweats and fatigue despite treatment of thyroid issue.  She is treated with Synthroid 100 mcg daily and thyroid levels have been within normal range.  She reports good appetite.  She continues to be active.  Wonders if perhaps anxiety may be playing a part.  Would be interested in starting medication.  She did not tolerate Lexapro previously.   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    Current Outpatient Medications:    albuterol (VENTOLIN HFA) 108 (90 Base) MCG/ACT inhaler, USE 2 PUFFS EVERY 6 HOURS AS NEEDED, Disp: 8.5 g, Rfl: 0   dicyclomine (BENTYL) 10 MG capsule, Take '10mg'$  daily before meals and at bedtime as needed., Disp: 60 capsule, Rfl: 5   diphenoxylate-atropine (LOMOTIL) 2.5-0.025 MG tablet, Take 2 tablets by mouth 4 (four) times daily as needed for diarrhea or loose stools., Disp: 30 tablet, Rfl: 0   levothyroxine (SYNTHROID) 100 MCG tablet, TAKE 1 TABLET BY MOUTH EVERY MORNING BEFORE BREAKFAST, Disp: 90 tablet, Rfl: 1   omeprazole (PRILOSEC) 20 MG capsule, Take 1 capsule (20 mg total) by mouth daily. To REPLACE Protonix, Disp: 90 capsule, Rfl: 3   ondansetron (ZOFRAN-ODT) 8 MG disintegrating tablet, Take 1 tablet (8 mg total) by mouth every 6 (six) hours  as needed for nausea or vomiting., Disp: 20 tablet, Rfl: 1   polyethylene glycol powder (MIRALAX) 17 GM/SCOOP powder, Take one capful daily if no bowel movement within past 48 hours. 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 occasional marijuana (once weekly)   Sexual activity: Yes    Birth control/protection: Surgical    Comment: tubal  Other Topics Concern   Not on file  Social History Narrative   Not on file   Social Determinants of Health   Financial Resource Strain: Not on file  Food Insecurity: Not on file  Transportation Needs: Not on file  Physical Activity: Not on file  Stress: Not on file  Social Connections: Not on file  Intimate Partner Violence: Not on file   Family History  Problem Relation Age of Onset   Anxiety disorder Mother    Asthma Mother    COPD Mother    Hypertension Mother    Hyperlipidemia Mother    Osteoporosis Mother    Diabetes Mother    Irritable bowel syndrome Mother    Heart disease Father    Heart attack Father    Bipolar disorder Sister    Schizophrenia Sister    Drug abuse Sister    Drug abuse Brother    Anxiety disorder Brother    Depression Brother    Thyroid disease Brother    Hyperlipidemia Maternal Grandmother    Hypertension Maternal Grandmother    Stroke Maternal Grandmother    Alzheimer's disease Maternal Grandmother     Cancer Maternal Grandfather        lung   Heart disease Maternal Grandfather    Kidney disease Paternal Grandmother    Cancer Paternal Grandfather    Asthma Son    Colon cancer Neg Hx    Inflammatory bowel disease Neg Hx    Celiac disease Neg Hx     Objective: Office vital signs reviewed. BP 127/79   Pulse 64   Temp 98.8 F (37.1 C)   Ht '5\' 5"'$  (1.651 m)   Wt 145 lb 3.2 oz (65.9 kg)   LMP 05/02/2022   SpO2 99%   BMI 24.16 kg/m   Physical Examination:  General: Awake, alert, well appearing female, No acute distress HEENT: Medial aspects of bilateral upper eyelids with xanthelasma deposits Cardio: regular rate and rhythm, S1S2 heard, no murmurs appreciated Pulm: clear to auscultation bilaterally, no wheezes, rhonchi or rales; normal work of breathing on room air GI: soft, non-tender, non-distended, bowel sounds present x4, no hepatomegaly, no Neuro: No tremor Psych: Mood stable, speech normal, affect appropriate.  Pleasant and interactive     05/17/2022    3:15 PM 01/17/2021    9:00 AM 09/25/2020    2:24 PM  Depression screen PHQ 2/9  Decreased Interest 0 1 0  Down, Depressed, Hopeless 0 1 1  PHQ - 2 Score 0 2 1  Altered sleeping 0 0 1  Tired, decreased energy 0 1 1  Change in appetite 0 1 1  Feeling bad or failure about yourself  0 1 1  Trouble concentrating 0 0 0  Moving slowly or fidgety/restless 0 0 0  Suicidal thoughts 0 0 0  PHQ-9 Score 0 5 5  Difficult doing work/chores Not difficult at all Not difficult at all Somewhat difficult      05/17/2022    3:15 PM 01/17/2021    9:02 AM 09/25/2020    2:24 PM 03/16/2020    1:54 PM  GAD 7 : Generalized Anxiety Score  Nervous, Anxious, on Edge '1 1 1 1  '$ Control/stop worrying '1 1 1 1  '$ Worry too much - different things 1 0 1 2  Trouble relaxing '1 1 1 2  '$ Restless 0 0 1 1  Easily annoyed or irritable 0 0 1 1  Afraid - awful might happen 0 '1 2 2  '$ Total GAD 7 Score '4 4 8 10  '$ Anxiety Difficulty  Not difficult at all Somewhat  difficult Somewhat difficult   Assessment/ Plan: 42 y.o. female   Xanthelasma of eyelid, bilateral - Plan: CMP14+EGFR, Lipid panel  Chronic fatigue - Plan: Bayer DCA Hb A1c Waived, CBC, Iron, TIBC and Ferritin Panel, Vitamin B12, ANA w/Reflex if Positive, C-reactive  protein, Sedimentation Rate  Night sweat - Plan: Bayer DCA Hb A1c Waived, CBC, Iron, TIBC and Ferritin Panel, Vitamin B12, ANA w/Reflex if Positive, C-reactive protein, Sedimentation Rate  GAD (generalized anxiety disorder) - Plan: desvenlafaxine (PRISTIQ) 50 MG 24 hr tablet  Depression, major, single episode, mild (HCC) - Plan: desvenlafaxine (PRISTIQ) 50 MG 24 hr tablet  Lesions on the eyelids are consistent with xanthelasma.  I have placed fasting lab orders and she will return in the next couple of days to have these collected.  Further treatment plan pending these results  Will look for metabolic etiology of chronic fatigue but could be manifestation of uncontrolled depression and anxiety.  I am going to place her on Pristiq 50 mg and reassess her in the next 6 to 8 weeks, sooner if concerns arise.  No orders of the defined types were placed in this encounter.  No orders of the defined types were placed in this encounter.    Janora Norlander, DO [place] [phone]

## 2022-05-20 ENCOUNTER — Encounter: Payer: Self-pay | Admitting: Family Medicine

## 2022-05-20 ENCOUNTER — Telehealth: Payer: Self-pay

## 2022-05-20 NOTE — Telephone Encounter (Signed)
Brenda [name] ([name]) PA Case ID #: [id number] Rx #: [id number] Need Help? Call us at [phone] Status sent iconSent to Plan today Drug Desvenlafaxine Succinate ER '50MG'$  er tablets ePA cloud Child psychotherapist Electronic PA Form [phone] NCPDP) Original Claim Info [address] [place], [place] REQR [phone] 02DRUG REQUIRES PRIOR AUTHORIZATION 03(

## 2022-05-22 NOTE — Telephone Encounter (Signed)
Approved on March 11 Your PA request has been approved. Additional information will be provided in the approval communication. (Message 1145) Authorization Expiration Date: 05/19/2023

## 2022-05-23 ENCOUNTER — Other Ambulatory Visit: Payer: 59

## 2022-05-23 DIAGNOSIS — R5382 Chronic fatigue, unspecified: Secondary | ICD-10-CM | POA: Diagnosis not present

## 2022-05-23 DIAGNOSIS — H0263 Xanthelasma of right eye, unspecified eyelid: Secondary | ICD-10-CM | POA: Diagnosis not present

## 2022-05-23 DIAGNOSIS — H0266 Xanthelasma of left eye, unspecified eyelid: Secondary | ICD-10-CM | POA: Diagnosis not present

## 2022-05-23 DIAGNOSIS — R61 Generalized hyperhidrosis: Secondary | ICD-10-CM | POA: Diagnosis not present

## 2022-05-23 LAB — BAYER DCA HB A1C WAIVED: HB A1C (BAYER DCA - WAIVED): 4.1 % — ABNORMAL LOW (ref 4.8–5.6)

## 2022-05-23 LAB — LIPID PANEL

## 2022-05-24 LAB — VITAMIN B12: Vitamin B-12: 480 pg/mL (ref 232–1245)

## 2022-05-24 LAB — CMP14+EGFR
ALT: 15 IU/L (ref 0–32)
AST: 23 IU/L (ref 0–40)
Albumin/Globulin Ratio: 1.6 (ref 1.2–2.2)
Albumin: 4.6 g/dL (ref 3.9–4.9)
Alkaline Phosphatase: 72 IU/L (ref 44–121)
BUN/Creatinine Ratio: 12 (ref 9–23)
BUN: 9 mg/dL (ref 6–24)
Bilirubin Total: 0.5 mg/dL (ref 0.0–1.2)
CO2: 21 mmol/L (ref 20–29)
Calcium: 9.7 mg/dL (ref 8.7–10.2)
Chloride: 101 mmol/L (ref 96–106)
Creatinine, Ser: 0.78 mg/dL (ref 0.57–1.00)
Globulin, Total: 2.9 g/dL (ref 1.5–4.5)
Glucose: 93 mg/dL (ref 70–99)
Potassium: 4.3 mmol/L (ref 3.5–5.2)
Sodium: 137 mmol/L (ref 134–144)
Total Protein: 7.5 g/dL (ref 6.0–8.5)
eGFR: 98 mL/min/{1.73_m2} (ref >59)

## 2022-05-24 LAB — IRON,TIBC AND FERRITIN PANEL
Ferritin: 119 ng/mL (ref 15–150)
Iron Saturation: 27 % (ref 15–55)
Iron: 86 ug/dL (ref 27–159)
Total Iron Binding Capacity: 319 ug/dL (ref 250–450)
UIBC: 233 ug/dL (ref 131–425)

## 2022-05-24 LAB — LIPID PANEL
Chol/HDL Ratio: 4.3 ratio (ref 0.0–4.4)
Cholesterol, Total: 182 mg/dL (ref 100–199)
HDL: 42 mg/dL (ref >39)
LDL Chol Calc (NIH): 123 mg/dL — ABNORMAL HIGH (ref 0–99)
Triglycerides: 91 mg/dL (ref 0–149)
VLDL Cholesterol Cal: 17 mg/dL (ref 5–40)

## 2022-05-24 LAB — CBC
Hematocrit: 41.9 % (ref 34.0–46.6)
Hemoglobin: 14.1 g/dL (ref 11.1–15.9)
MCH: 31.2 pg (ref 26.6–33.0)
MCHC: 33.7 g/dL (ref 31.5–35.7)
MCV: 93 fL (ref 79–97)
Platelets: 299 10*3/uL (ref 150–450)
RBC: 4.52 x10E6/uL (ref 3.77–5.28)
RDW: 11.8 % (ref 11.7–15.4)
WBC: 6.5 10*3/uL (ref 3.4–10.8)

## 2022-05-24 LAB — C-REACTIVE PROTEIN: CRP: 1 mg/L (ref 0–10)

## 2022-05-24 LAB — SEDIMENTATION RATE: Sed Rate: 6 mm/hr (ref 0–32)

## 2022-05-24 LAB — ANA W/REFLEX IF POSITIVE: Anti Nuclear Antibody (ANA): NEGATIVE

## 2022-06-26 ENCOUNTER — Other Ambulatory Visit: Payer: Self-pay | Admitting: Family Medicine

## 2022-07-08 ENCOUNTER — Ambulatory Visit (INDEPENDENT_AMBULATORY_CARE_PROVIDER_SITE_OTHER): Payer: 59 | Admitting: Family Medicine

## 2022-07-08 ENCOUNTER — Encounter: Payer: Self-pay | Admitting: "Endocrinology

## 2022-07-08 VITALS — BP 129/83 | HR 86 | Temp 98.3°F | Ht 65.0 in | Wt 143.0 lb

## 2022-07-08 DIAGNOSIS — R5382 Chronic fatigue, unspecified: Secondary | ICD-10-CM | POA: Diagnosis not present

## 2022-07-08 DIAGNOSIS — E890 Postprocedural hypothyroidism: Secondary | ICD-10-CM | POA: Diagnosis not present

## 2022-07-08 DIAGNOSIS — F411 Generalized anxiety disorder: Secondary | ICD-10-CM | POA: Diagnosis not present

## 2022-07-08 DIAGNOSIS — H0263 Xanthelasma of right eye, unspecified eyelid: Secondary | ICD-10-CM

## 2022-07-08 DIAGNOSIS — R61 Generalized hyperhidrosis: Secondary | ICD-10-CM | POA: Diagnosis not present

## 2022-07-08 DIAGNOSIS — E7800 Pure hypercholesterolemia, unspecified: Secondary | ICD-10-CM | POA: Diagnosis not present

## 2022-07-08 DIAGNOSIS — H0266 Xanthelasma of left eye, unspecified eyelid: Secondary | ICD-10-CM | POA: Diagnosis not present

## 2022-07-08 DIAGNOSIS — F320 Major depressive disorder, single episode, mild: Secondary | ICD-10-CM

## 2022-07-08 MED ORDER — DESVENLAFAXINE SUCCINATE ER 50 MG PO TB24: 50.0000 mg | ORAL_TABLET | Freq: Every day | ORAL | 3 refills | Status: DC

## 2022-07-08 MED ORDER — ROSUVASTATIN CALCIUM 10 MG PO TABS
10.0000 mg | ORAL_TABLET | Freq: Every day | ORAL | 3 refills | Status: DC
Start: 2022-07-08 — End: 2023-08-06

## 2022-07-08 MED ORDER — HYDROXYZINE HCL 25 MG PO TABS: 12.5000 mg | ORAL_TABLET | Freq: Every evening | ORAL | 0 refills | Status: AC | PRN

## 2022-07-08 NOTE — Progress Notes (Signed)
Subjective: CC: Follow-up anxiety PCP: Raliegh Ip, DO ZOX:[id number] Brenda [name] is a 42 y.o. female presenting to clinic today for:  1.  Anxiety with depression Patient reports improvement in mood with Pristiq.  She is tolerating this without difficulty.  Continues to have some sleep issues but overall feels like she is sleeping better.  On average sleeping 5 to 6 hours before waking up.  Still has some daytime sleepiness but she is easily able to take a nap if needed.  2.  Hyperlipidemia Persistent pure hypercholesterolemia noted on recent fasting lipid panel.  Xanthelasmas remain present and she is wanting to start cholesterol medicine today for this in hopes that it might help prevent future lesions and/or strength the current ones   ROS: Per HPI  Allergies  Allergen Reactions   Milk-Related Compounds Nausea And Vomiting    Vomiting and sweating   Past Medical History:  Diagnosis Date   Anxiety    Malignant neoplasm of thyroid gland ([facility]) 09/30/2019    Current Outpatient Medications:    albuterol (VENTOLIN HFA) 108 (90 Base) MCG/ACT inhaler, USE 2 PUFFS EVERY 6 HOURS AS NEEDED, Disp: 8.5 g, Rfl: 0   desvenlafaxine (PRISTIQ) 50 MG 24 hr tablet, Take 1 tablet (50 mg total) by mouth daily., Disp: 90 tablet, Rfl: 0   dicyclomine (BENTYL) 10 MG capsule, Take 10mg  daily before meals and at bedtime as needed., Disp: 60 capsule, Rfl: 5   diphenoxylate-atropine (LOMOTIL) 2.5-0.025 MG tablet, Take 2 tablets by mouth 4 (four) times daily as needed for diarrhea or loose stools., Disp: 30 tablet, Rfl: 0   levothyroxine (SYNTHROID) 100 MCG tablet, TAKE 1 TABLET BY MOUTH EVERY MORNING BEFORE BREAKFAST, Disp: 90 tablet, Rfl: 1   ondansetron (ZOFRAN-ODT) 8 MG disintegrating tablet, Take 1 tablet (8 mg total) by mouth every 6 (six) hours as needed for nausea or vomiting., Disp: 20 tablet, Rfl: 1   polyethylene glycol powder (MIRALAX) 17 GM/SCOOP powder, Take one capful daily if no bowel  movement within past 48 hours. 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 occasional marijuana (once weekly)   Sexual activity: Yes    Birth control/protection: Surgical    Comment: tubal  Other Topics Concern   Not on file  Social History Narrative   Not on file   Social Determinants of Health   Financial Resource Strain: Low Risk  (07/08/2022)   Overall Financial Resource Strain (CARDIA)    Difficulty of Paying Living Expenses: Not very hard  Food Insecurity: No Food Insecurity (07/08/2022)   Hunger Vital Sign    Worried About Running Out of Food in the Last Year: Never true    Ran Out of Food in the Last Year: Never true  Transportation Needs: No Transportation  Needs (07/08/2022)   PRAPARE - Administrator, Civil Service (Medical): No    Lack of Transportation (Non-Medical): No  Physical Activity: Insufficiently Active (07/08/2022)   Exercise Vital Sign    Days of Exercise per Week: 2 days    Minutes of Exercise per Session: 30 min  Stress: No Stress Concern Present (07/08/2022)   [employer] of Occupational Health - Occupational Stress Questionnaire    Feeling of Stress : Only a little  Social Connections: Moderately Isolated (07/08/2022)   Social Connection and Isolation Panel [NHANES]    Frequency of Communication with Friends and Family: More  than three times a week    Frequency of Social Gatherings with Friends and Family: Once a week    Attends Religious Services: Never    Database administrator or Organizations: No    Attends Engineer, structural: Not on file    Marital Status: Married  Catering manager Violence: Not on file   Family History  Problem Relation Age of Onset   Anxiety disorder Mother    Asthma Mother    COPD Mother    Hypertension Mother    Hyperlipidemia Mother    Osteoporosis Mother    Diabetes Mother    Irritable bowel syndrome Mother    Heart disease Father    Heart attack Father    Bipolar disorder Sister    Schizophrenia Sister    Drug abuse Sister    Drug abuse Brother    Anxiety disorder Brother    Depression Brother    Thyroid disease Brother    Hyperlipidemia Maternal Grandmother    Hypertension Maternal Grandmother    Stroke Maternal Grandmother    Alzheimer's disease Maternal Grandmother    Cancer Maternal Grandfather        lung   Heart disease Maternal Grandfather    Kidney disease Paternal Grandmother    Cancer Paternal Grandfather    Asthma Son    Colon cancer Neg Hx    Inflammatory bowel disease Neg Hx    Celiac disease Neg Hx     Objective: Office vital signs reviewed. BP 129/83   Pulse 86   Temp 98.3 F (36.8 C)   Ht 5\' 5"  (1.651 m)   Wt 143 lb (64.9 kg)   LMP 06/07/2022   SpO2 99%   BMI 23.80 kg/m   Physical Examination:  General: Awake, alert, well nourished, No acute distress HEENT: Xanthelasmas noted bilaterally on medial upper eyelids. Cardio: regular rate and rhythm, S1S2 heard, no murmurs appreciated Pulm: clear to auscultation bilaterally, no wheezes, rhonchi or rales; normal work of breathing on room air Psych: Mood stable, speech normal, affect appropriate.  Very pleasant, interactive     07/08/2022    1:09 PM 07/08/2022    1:03 PM 05/17/2022    3:15 PM  Depression screen PHQ 2/9  Decreased Interest 1 0 0  Down, Depressed, Hopeless 0 0  0  PHQ - 2 Score 1 0 0  Altered sleeping 1 0 0  Tired, decreased energy 1 0 0  Change in appetite 0 0 0  Feeling bad or failure about yourself  1 0 0  Trouble concentrating 0 0 0  Moving slowly or fidgety/restless 0 0 0  Suicidal thoughts 0 0 0  PHQ-9 Score 4 0 0  Difficult doing work/chores Not difficult at all Not difficult at all Not difficult at all      07/08/2022    1:09 PM 05/17/2022  3:15 PM 01/17/2021    9:02 AM 09/25/2020    2:24 PM  GAD 7 : Generalized Anxiety Score  Nervous, Anxious, on Edge 1 1 1 1   Control/stop worrying 0 1 1 1   Worry too much - different things 1 1 0 1  Trouble relaxing 0 1 1 1   Restless 0 0 0 1  Easily annoyed or irritable 0 0 0 1  Afraid - awful might happen 0 0 1 2  Total GAD 7 Score 2 4 4 8   Anxiety Difficulty Not difficult at all  Not difficult at all Somewhat difficult      Assessment/ Plan: 42 y.o. female   GAD (generalized anxiety disorder) - Plan: hydrOXYzine (ATARAX) 25 MG tablet, desvenlafaxine (PRISTIQ) 50 MG 24 hr tablet  Depression, major, single episode, mild (HCC) - Plan: desvenlafaxine (PRISTIQ) 50 MG 24 hr tablet  Chronic fatigue  Night sweat  Xanthelasma of eyelid, bilateral - Plan: rosuvastatin (CRESTOR) 10 MG tablet, Lipid panel, Hepatic function panel  Pure hypercholesterolemia - Plan: rosuvastatin (CRESTOR) 10 MG tablet, Lipid panel, Hepatic function panel  Doing a lot better on Pristiq.  Atarax added prn anxiety attack/ insomnia.  Will reassess in 57m, sooner if concerns arise.  Has thyroid labs ordered from specialist today.  Sleeping better but continues to have some daytime fatigue.  Plan to start Crestor for Lipid given xanthelesmas.  Repeat FLP/ LFT in 3 m  No orders of the defined types were placed in this encounter.  No orders of the defined types were placed in this encounter.    Raliegh Ip, DO [facility] [phone]

## 2022-07-17 ENCOUNTER — Encounter: Payer: Self-pay | Admitting: "Endocrinology

## 2022-07-17 ENCOUNTER — Ambulatory Visit: Payer: 59 | Admitting: "Endocrinology

## 2022-07-17 VITALS — BP 118/78 | HR 72 | Ht 65.0 in | Wt 143.6 lb

## 2022-07-17 DIAGNOSIS — E782 Mixed hyperlipidemia: Secondary | ICD-10-CM

## 2022-07-17 DIAGNOSIS — E876 Hypokalemia: Secondary | ICD-10-CM

## 2022-07-17 DIAGNOSIS — C73 Malignant neoplasm of thyroid gland: Secondary | ICD-10-CM | POA: Diagnosis not present

## 2022-07-17 DIAGNOSIS — E890 Postprocedural hypothyroidism: Secondary | ICD-10-CM

## 2022-07-17 LAB — TSH: TSH: 1.26 u[IU]/mL (ref 0.450–4.500)

## 2022-07-17 LAB — T4, FREE: Free T4: 1.63 ng/dL (ref 0.82–1.77)

## 2022-07-17 LAB — THYROGLOBULIN LEVEL: Thyroglobulin (TG-RIA): <2 ng/mL

## 2022-07-17 NOTE — Progress Notes (Unsigned)
07/17/2022, 5:02 PM      Endocrinology follow-up note   Subjective:    Patient ID: Brenda [name], female    DOB: 04-Mar-1981, PCP Raliegh Ip, DO   Past Medical History:  Diagnosis Date   Anxiety    Malignant neoplasm of thyroid gland (HCC) 09/30/2019   Past Surgical History:  Procedure Laterality Date   APPENDECTOMY     THYROIDECTOMY Left 09/14/2019   Procedure: LEFT HEMITHYROIDECTOMY;  Surgeon: Newman Pies, MD;  Location: [facility];  Service: ENT;  Laterality: Left;   Social History   Socioeconomic History   Marital status: Married    Spouse name: [place]   Number of children: 2   Years of education: some college   Highest education level: Some college, no degree  Occupational History    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 occasional marijuana (once weekly)   Sexual activity: Yes    Birth control/protection: Surgical    Comment: tubal  Other Topics Concern   Not on file  Social History Narrative   Not on file   Social Determinants of Health   Financial Resource Strain: Low Risk  (07/08/2022)   Overall Financial Resource Strain (CARDIA)    Difficulty of Paying Living Expenses: Not very hard  Food Insecurity: No Food Insecurity (07/08/2022)   Hunger Vital Sign    Worried About Running Out of Food in the Last Year: Never true    Ran Out of Food in the Last Year: Never true  Transportation Needs: No Transportation Needs (07/08/2022)   PRAPARE - Administrator, Civil Service (Medical): No    Lack of Transportation (Non-Medical): No  Physical Activity: Insufficiently Active (07/08/2022)   Exercise Vital Sign    Days  of Exercise per Week: 2 days    Minutes of Exercise per Session: 30 min  Stress: No Stress Concern Present (07/08/2022)   [employer] of Occupational Health - Occupational Stress Questionnaire    Feeling of Stress : Only a little  Social Connections: Moderately Isolated (07/08/2022)   Social Connection and Isolation Panel [NHANES]    Frequency of Communication with Friends and Family: More than three times a week    Frequency of Social Gatherings with Friends and Family: Once a week    Attends Religious Services: Never    Database administrator or Organizations: No    Attends Engineer, structural: Not on file    Marital Status: Married   Family History  Problem Relation Age of Onset   Anxiety disorder Mother    Asthma Mother    COPD Mother    Hypertension Mother    Hyperlipidemia Mother    Osteoporosis Mother    Diabetes Mother    Irritable bowel syndrome Mother    Heart disease Father    Heart attack Father  Bipolar disorder Sister    Schizophrenia Sister    Drug abuse Sister    Drug abuse Brother    Anxiety disorder Brother    Depression Brother    Thyroid disease Brother    Hyperlipidemia Maternal Grandmother    Hypertension Maternal Grandmother    Stroke Maternal Grandmother    Alzheimer's disease Maternal Grandmother    Cancer Maternal Grandfather        lung   Heart disease Maternal Grandfather    Kidney disease Paternal Grandmother    Cancer Paternal Grandfather    Asthma Son    Colon cancer Neg Hx    Inflammatory bowel disease Neg Hx    Celiac disease Neg Hx    Outpatient Encounter Medications as of 07/17/2022  Medication Sig   albuterol (VENTOLIN HFA) 108 (90 Base) MCG/ACT inhaler USE 2 PUFFS EVERY 6 HOURS AS NEEDED   desvenlafaxine (PRISTIQ) 50 MG 24 hr tablet Take 1 tablet (50 mg total) by mouth daily.   dicyclomine (BENTYL) 10 MG capsule Take 10mg  daily before meals and at bedtime as needed.   diphenoxylate-atropine (LOMOTIL) 2.5-0.025 MG  tablet Take 2 tablets by mouth 4 (four) times daily as needed for diarrhea or loose stools.   hydrOXYzine (ATARAX) 25 MG tablet Take 0.5-1 tablets (12.5-25 mg total) by mouth at bedtime as needed for anxiety (or sleep).   levothyroxine (SYNTHROID) 100 MCG tablet TAKE 1 TABLET BY MOUTH EVERY MORNING BEFORE BREAKFAST   ondansetron (ZOFRAN-ODT) 8 MG disintegrating tablet Take 1 tablet (8 mg total) by mouth every 6 (six) hours as needed for nausea or vomiting.   polyethylene glycol powder (MIRALAX) 17 GM/SCOOP powder Take one capful daily if no bowel movement within past 48 hours. Can take once daily if needed.   promethazine (PHENERGAN) 25 MG suppository Place rectally.   rosuvastatin (CRESTOR) 10 MG tablet Take 1 tablet (10 mg total) by mouth daily.   No facility-administered encounter medications on file as of 07/17/2022.   ALLERGIES: Allergies  Allergen Reactions   Milk-Related Compounds Nausea And Vomiting    Vomiting and sweating    VACCINATION STATUS:  There is no immunization history on file for this patient.  HPI Brenda [name] is 42 y.o. female who presents today to follow-up for postsurgical hypothyroidism and history of thyroid malignancy.  She underwent total thyroidectomy for thyroid cancer.  She is status post Thyrogen stimulated I-131 remnant ablation with post therapy whole-body scan.  See below.  -PMD:  Raliegh Ip, DO.    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 A sample was sent for Afirma molecular study which was significant for  > 99% risk of malignancy reported on Aug 03, 2019. 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 -She underwent I-131 thyroid remnant ablation on November 19, 2019 with  whole-body scan on November 29, 2019 revealing significant uptake in the neck, inconclusive for distant metastasis.  Her surveillance thyroid/neck ultrasound in April 2022 was  negative for any thyroid remnant or residual nor lymphadenopathy.  Her previsit thyroid and stimulated whole-body scan is consistent with treatment effect, no evidence of recurrence or metastasis.   Patient has done very well following her surgery, required calcium supplement briefly.  She remains on  levothyroxine 88 mcg p.o. daily before breakfast.  Her previsit thyroid function tests are consistent with slight under replacement.   She reports compliance and consistency to his medication.  Her previsit labs are consistent with appropriate replacement.    She denies any family history of thyroid malignancy, however some thyroid dysfunction in a half brother. She denies cigarette smoking, however marijuana smoker. She denies dysphagia, shortness of breath, voice change. She denies palpitations, tremors, nor heat/cold intolerance. She reports steady body weight.  She is also on low-dose potassium supplement for mild hypokalemia.  Review of Systems She has steady weight.  Otherwise limited as above.  Objective:       07/17/2022    9:52 AM 07/08/2022    1:03 PM 05/17/2022    3:15 PM  Vitals with BMI  Height 5\' 5"  5\' 5"  5\' 5"   Weight 143 lbs 10 oz 143 lbs 145 lbs 3 oz  BMI 23.9 23.8 24.16  Systolic 118 129 161  Diastolic 78 83 79  Pulse 72 86 64    BP 118/78   Pulse 72   Ht 5\' 5"  (1.651 m)   Wt 143 lb 9.6 oz (65.1 kg)   LMP 06/07/2022   BMI 23.90 kg/m   Wt Readings from Last 3 Encounters:  07/17/22 143 lb 9.6 oz (65.1 kg)  07/08/22 143 lb (64.9 kg)  05/17/22 145 lb 3.2 oz (65.9 kg)    Physical Exam   CMP ( most recent) CMP     Component Value Date/Time   NA 137 05/23/2022 1036   K 4.3 05/23/2022 1036   CL 101 05/23/2022 1036   CO2 21 05/23/2022 1036   GLUCOSE 93 05/23/2022 1036   GLUCOSE 100  (H) 06/22/2021 0836   BUN 9 05/23/2022 1036   CREATININE 0.78 05/23/2022 1036   CALCIUM 9.7 05/23/2022 1036   PROT 7.5 05/23/2022 1036   ALBUMIN 4.6 05/23/2022 1036   AST 23 05/23/2022 1036   ALT 15 05/23/2022 1036   ALKPHOS 72 05/23/2022 1036   BILITOT 0.5 05/23/2022 1036   GFRNONAA >60 06/22/2021 0836   GFRAA 122 07/02/2019 1058     Lipid Panel ( most recent) Lipid Panel     Component Value Date/Time   CHOL 182 05/23/2022 1036   TRIG 91 05/23/2022 1036   HDL 42 05/23/2022 1036   CHOLHDL 4.3 05/23/2022 1036   LDLCALC 123 (H) 05/23/2022 1036   LABVLDL 17 05/23/2022 1036      Lab Results  Component Value Date   TSH 1.260 07/08/2022   TSH 4.570 (H) 01/02/2022   TSH 151.484 (H) 06/22/2021   TSH 1.390 01/03/2021   TSH 11.000 (H) 11/27/2020   TSH 3.290 10/09/2020   TSH 2.630 06/21/2020   TSH 1.410 01/06/2020   TSH 117.553 (H) 11/19/2019   TSH 3.940 10/07/2019   FREET4 1.63 07/08/2022   FREET4 1.38 01/02/2022   FREET4 1.21 (H) 06/22/2021   FREET4 1.33 01/03/2021   FREET4 1.48 11/27/2020   FREET4 1.88 (H) 10/09/2020   FREET4 1.20 06/21/2020   FREET4 1.51 01/06/2020   FREET4 1.21 (H) 11/19/2019   FREET4 1.72 10/07/2019      Assessment & Plan:   1. Postsurgical hypothyroidism 2. Malignant neoplasm of thyroid gland (HCC) -I reviewed  her current and existing labs and imaging studies with her. - I have reviewed her available thyroid records and clinically evaluated the patient.  Reviewed her most recent thyroid testing with whole-body scan as well as her labs. 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   Pathologic Stage Classification (pTNM, AJCC 8th Edition): pT2, pN not  assigned (no nodes submitted or found)   -In light of her follicular variant PTC, stage 2 cancer, relative  youth of the patient, she was offered  adjuvant therapy with I-131 thyroid remnant ablation. This treatment was administered on November 19, 2019.  Post therapy whole-body scan results are consistent with significant uptake in the neck, inconclusive for distant metastases-nondiagnostic due to the degree of uptake of tracer at the neck, despite shielding.     -Her surveillance   thyroid/neck ultrasound in April 2022 was negative for any remnant or residual nor lymphadenopathy.     She has recovered well from her surgery.  Her previsit thyroid stimulated whole-body scan completed on June 25, 2021 is consistent with treatment effect, no evidence of distant metastasis or recurrence.   She will be considered for surveillance thyroid/neck ultrasound after her next visit.   Her previsit thyroid function tests are consistent with slight under replacement.  I discussed and increase her levothyroxine to 100 mcg p.o. daily before breakfast.     - We discussed about the correct intake of her thyroid hormone, on empty stomach at fasting, with water, separated by at least 30 minutes from breakfast and other medications,  and separated by more than 4 hours from calcium, iron, multivitamins, acid reflux medications (PPIs). -Patient is made aware of the fact that thyroid hormone replacement is needed for life, dose to be adjusted by periodic monitoring of thyroid function tests.  She has not taken calcium supplements since her last visit.  Her previsit labs show calcium of 9.3.  She took herself off of the Os-Cal.  Her CMP also shows normalization of potassium at 4.0 status post supplement with potassium chloride.   - she is advised to maintain close follow up with Raliegh Ip, DO for primary care needs.    I spent 31 minutes in the care of the patient today including review of labs from Thyroid Function, CMP, and other relevant labs ; imaging/biopsy records (current and previous including  abstractions from other facilities); face-to-face time discussing  her lab results and symptoms, medications doses, her options of short and long term treatment based on the latest standards of care / guidelines;   and documenting the encounter.  Brenda [name]  participated in the discussions, expressed understanding, and voiced agreement with the above plans.  All questions were answered to her satisfaction. she is encouraged to contact clinic should she have any questions or concerns prior to her return visit.   Follow up plan: Return in about 6 months (around 01/17/2023) for Thyroid / Neck Ultrasound, F/U with Pre-visit Labs.   Marquis Lunch, MD [facility] [address] [place], [place] [address] Phone: [phone]  Fax: [phone]     07/17/2022, 5:02 PM  This note was partially dictated with voice recognition software. Similar sounding words can be transcribed inadequately or may not  be corrected upon review.

## 2022-07-18 DIAGNOSIS — E782 Mixed hyperlipidemia: Secondary | ICD-10-CM | POA: Insufficient documentation

## 2022-07-23 ENCOUNTER — Other Ambulatory Visit: Payer: Self-pay | Admitting: "Endocrinology

## 2022-09-08 IMAGING — DX DG CHEST 2V
2 series · 2 of 2 positions shown · non-contrast
Comparison: None.

CLINICAL DATA: Dyspnea on exertion

EXAM:
CHEST - 2 VIEW

[chest pa]
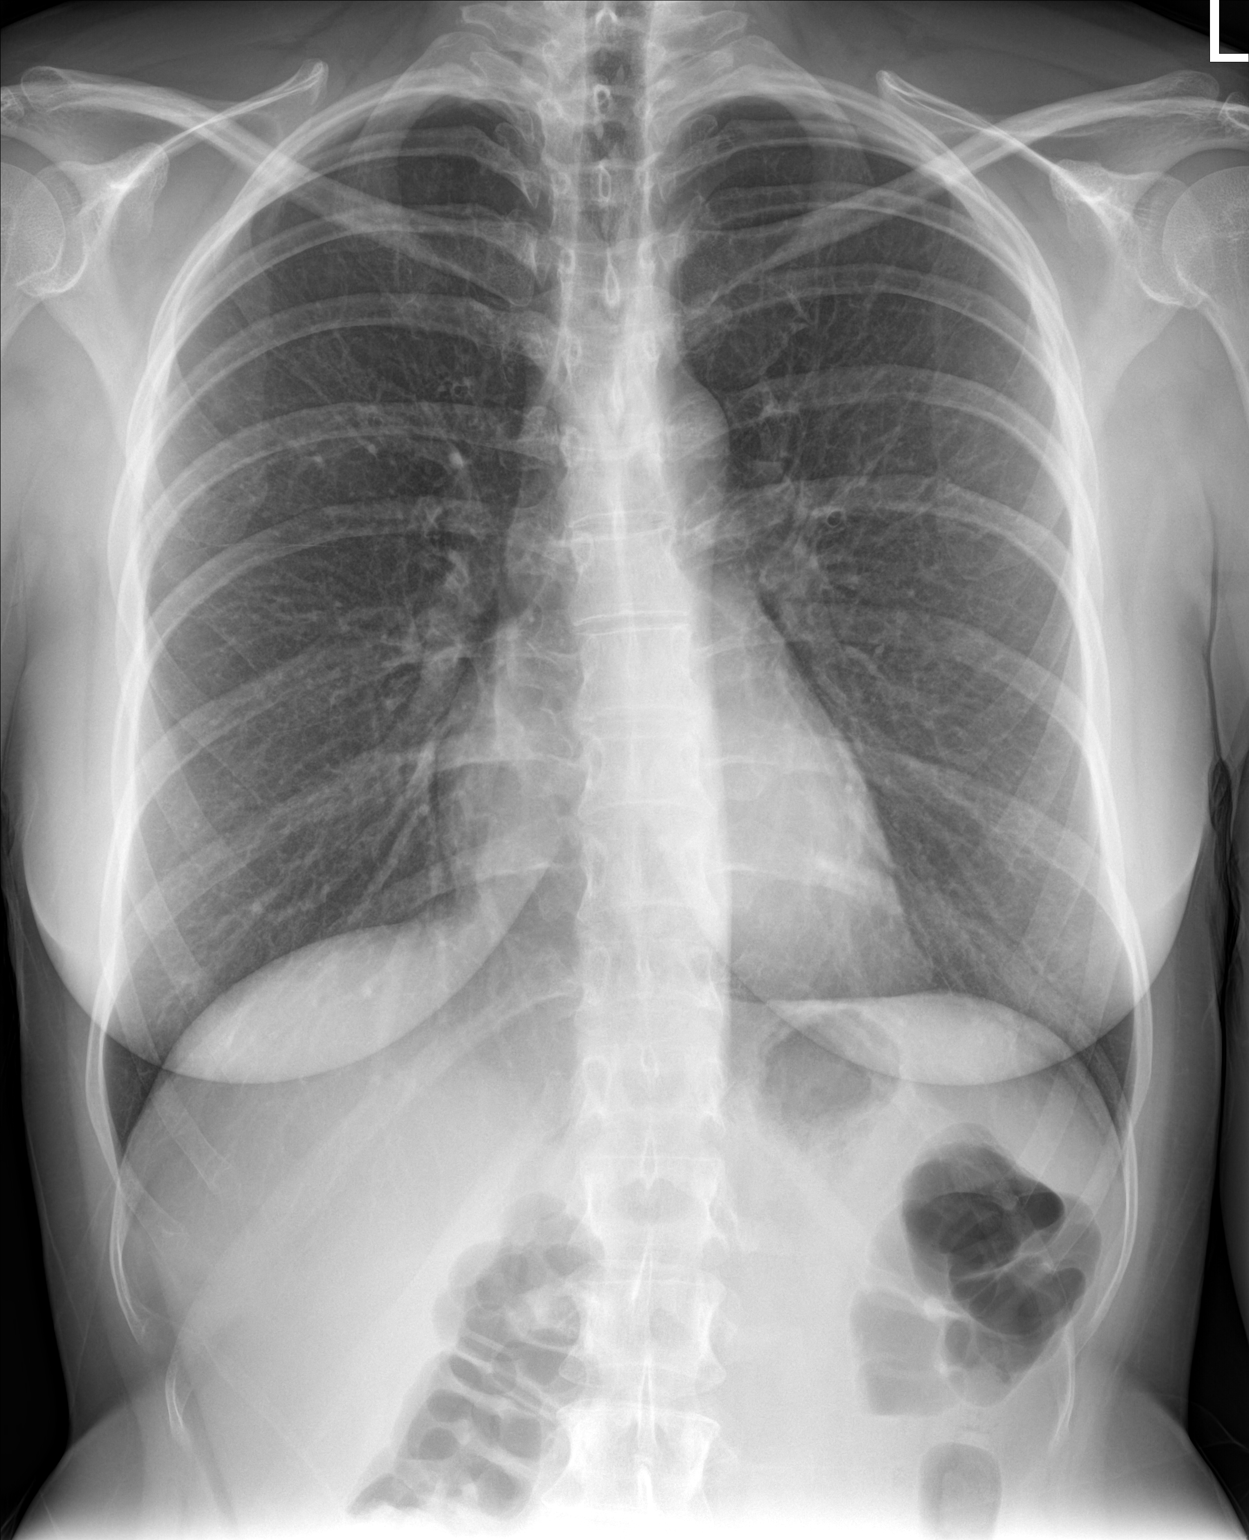

[chest lat]
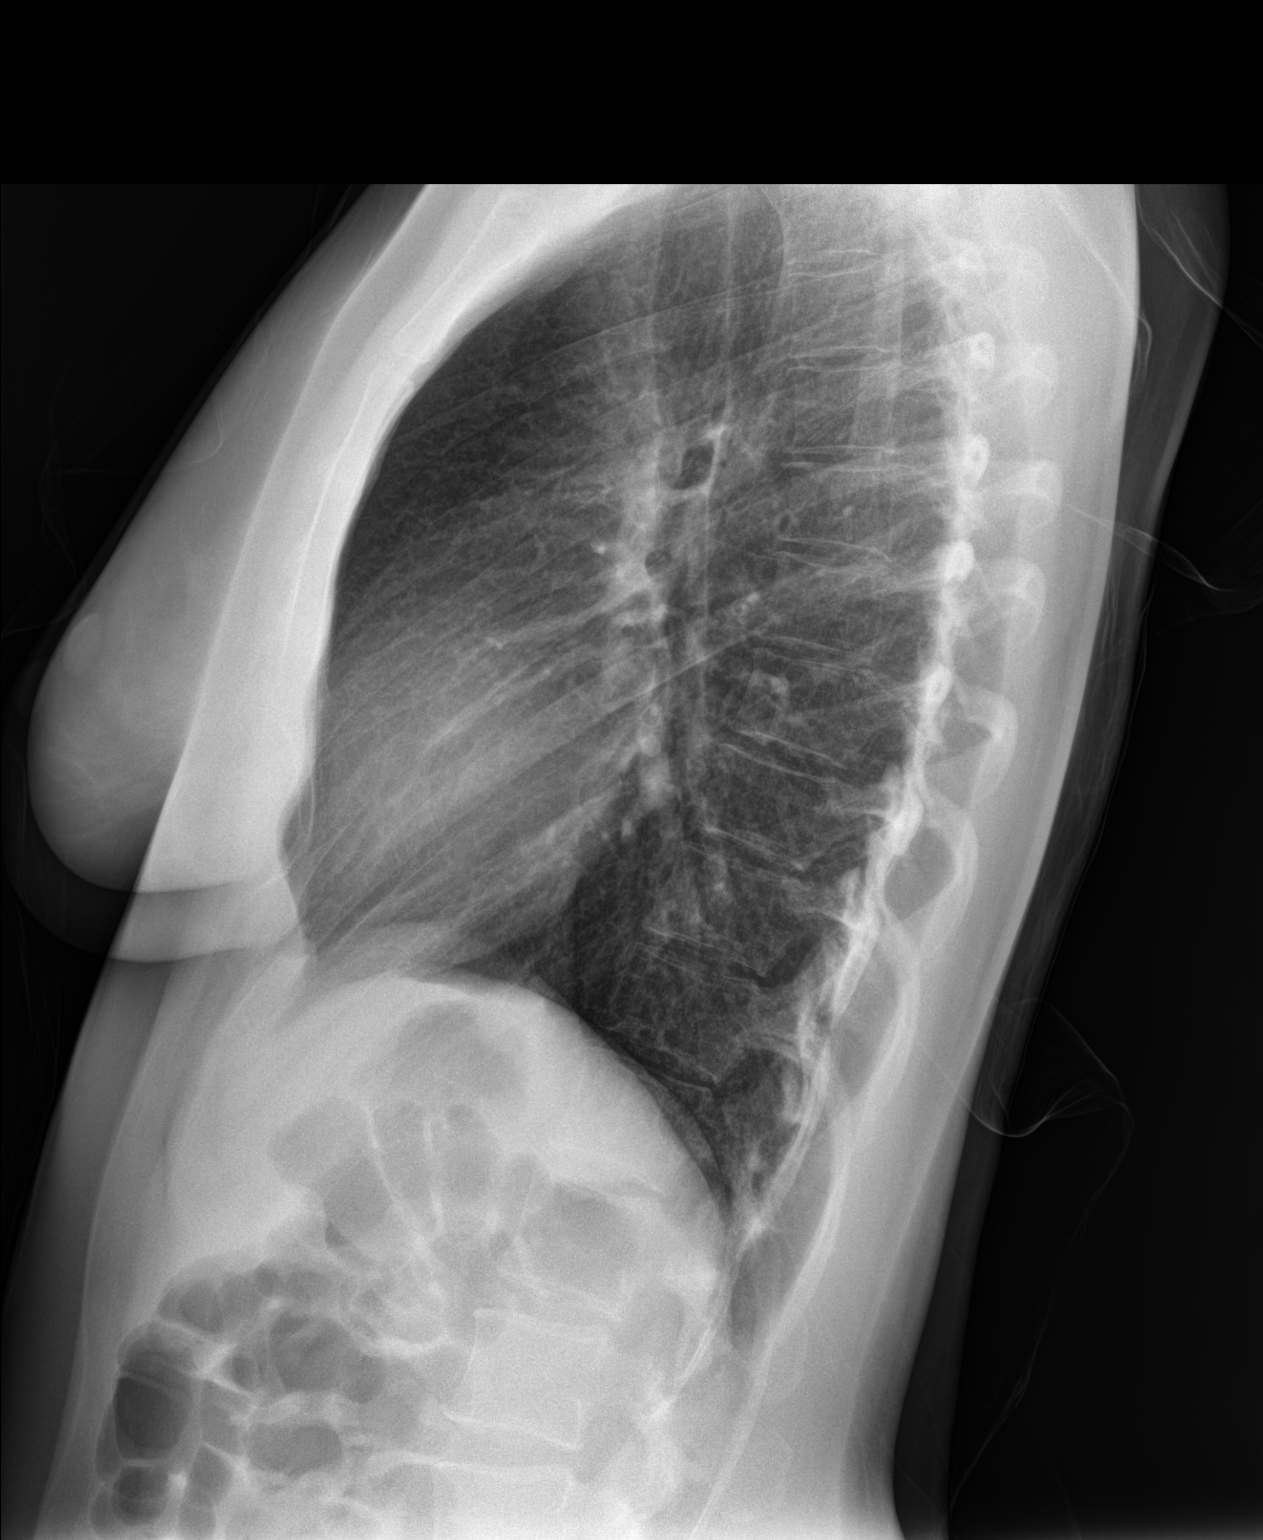

[2 of 2 positions shown; findings below may reference images not displayed]

FINDINGS: Midline trachea. Normal heart size and mediastinal contours. No
pleural effusion or pneumothorax. Clear lungs.
IMPRESSION: No acute cardiopulmonary disease.

## 2022-10-07 ENCOUNTER — Ambulatory Visit (INDEPENDENT_AMBULATORY_CARE_PROVIDER_SITE_OTHER): Payer: 59 | Admitting: Family Medicine

## 2022-10-07 ENCOUNTER — Encounter: Payer: Self-pay | Admitting: Family Medicine

## 2022-10-07 VITALS — BP 124/86 | HR 74 | Temp 98.5°F | Ht 65.0 in | Wt 142.0 lb

## 2022-10-07 DIAGNOSIS — F411 Generalized anxiety disorder: Secondary | ICD-10-CM | POA: Diagnosis not present

## 2022-10-07 DIAGNOSIS — Z114 Encounter for screening for human immunodeficiency virus [HIV]: Secondary | ICD-10-CM

## 2022-10-07 DIAGNOSIS — Z1159 Encounter for screening for other viral diseases: Secondary | ICD-10-CM | POA: Diagnosis not present

## 2022-10-07 DIAGNOSIS — H0266 Xanthelasma of left eye, unspecified eyelid: Secondary | ICD-10-CM | POA: Diagnosis not present

## 2022-10-07 DIAGNOSIS — H0263 Xanthelasma of right eye, unspecified eyelid: Secondary | ICD-10-CM

## 2022-10-07 DIAGNOSIS — F320 Major depressive disorder, single episode, mild: Secondary | ICD-10-CM | POA: Diagnosis not present

## 2022-10-07 DIAGNOSIS — E7800 Pure hypercholesterolemia, unspecified: Secondary | ICD-10-CM

## 2022-10-07 DIAGNOSIS — Z23 Encounter for immunization: Secondary | ICD-10-CM | POA: Diagnosis not present

## 2022-10-07 DIAGNOSIS — K589 Irritable bowel syndrome without diarrhea: Secondary | ICD-10-CM

## 2022-10-07 LAB — LIPID PANEL

## 2022-10-07 LAB — HEPATIC FUNCTION PANEL
ALT: 47 IU/L — ABNORMAL HIGH (ref 0–32)
AST: 36 IU/L (ref 0–40)
Albumin: 4.5 g/dL (ref 3.9–4.9)
Bilirubin Total: 0.7 mg/dL (ref 0.0–1.2)
Total Protein: 7.3 g/dL (ref 6.0–8.5)

## 2022-10-07 LAB — HEPATITIS C ANTIBODY

## 2022-10-07 LAB — HIV ANTIBODY (ROUTINE TESTING W REFLEX)

## 2022-10-07 MED ORDER — DICYCLOMINE HCL 10 MG PO CAPS
ORAL_CAPSULE | ORAL | 5 refills | Status: DC
Start: 2022-10-07 — End: 2024-01-09

## 2022-10-07 NOTE — Progress Notes (Signed)
Subjective: CC:HLD PCP: Raliegh Ip, DO [id number] Brenda [name] is a 42 y.o. female presenting to clinic today for:  1. HLD Started on Crestor 10mg  daily 3 months ago.  She reports compliance with the rosuvastatin and notes that there have been no issues with taking the medicine.  No reports of myalgia or other issues.  She is been working on diet and exercise in efforts to actually bring down her cholesterol as well  2. GAD Atarax started last visit.  Notes that she has only had to use a couple of doses of this and the Pristiq really has been managing her anxiety very well.  She feels like she is having good sleep.  She feels comfortable on the medication and does not wish to advance dose at all  3.  Irritable bowel syndrome with diarrhea Patient reports that she has intermittent episodes of abdominal cramping.  This is relieved by Bentyl.  Wondering if I can take over that medication for as needed use.  Utilizes it roughly 1-2 times per week.  No constipation symptoms.  No blood in stool, nausea or vomiting.   ROS: Per HPI  Allergies  Allergen Reactions   Milk-Related Compounds Nausea And Vomiting    Vomiting and sweating   Past Medical History:  Diagnosis Date   Anxiety    Malignant neoplasm of thyroid gland (HCC) 09/30/2019    Current Outpatient Medications:    albuterol (VENTOLIN HFA) 108 (90 Base) MCG/ACT inhaler, USE 2 PUFFS EVERY 6 HOURS AS NEEDED, Disp: 8.5 g, Rfl: 0   desvenlafaxine (PRISTIQ) 50 MG 24 hr tablet, Take 1 tablet (50 mg total) by mouth daily., Disp: 90 tablet, Rfl: 3   dicyclomine (BENTYL) 10 MG capsule, Take 10mg  daily before meals and at bedtime as needed., Disp: 60 capsule, Rfl: 5   diphenoxylate-atropine (LOMOTIL) 2.5-0.025 MG tablet, Take 2 tablets by mouth 4 (four) times daily as needed for diarrhea or loose stools., Disp: 30 tablet, Rfl: 0   hydrOXYzine (ATARAX) 25 MG tablet, Take 0.5-1 tablets (12.5-25 mg total) by mouth at bedtime as needed for  anxiety (or sleep)., Disp: 30 tablet, Rfl: 0   levothyroxine (SYNTHROID) 100 MCG tablet, TAKE 1 TABLET BY MOUTH EVERY MORNING BEFORE BREAKFAST, Disp: 90 tablet, Rfl: 1   ondansetron (ZOFRAN-ODT) 8 MG disintegrating tablet, Take 1 tablet (8 mg total) by mouth every 6 (six) hours as needed for nausea or vomiting., Disp: 20 tablet, Rfl: 1   polyethylene glycol powder (MIRALAX) 17 GM/SCOOP powder, Take one capful daily if no bowel movement within past 48 hours. 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 occasional marijuana (  once weekly)   Sexual activity: Yes    Birth control/protection: Surgical    Comment: tubal  Other Topics Concern   Not on file  Social History Narrative   Not on file   Social Determinants of Health   Financial Resource Strain: Low Risk  (07/08/2022)   Overall Financial Resource Strain (CARDIA)    Difficulty of Paying Living Expenses: Not very hard  Food Insecurity: No Food Insecurity (07/08/2022)   Hunger Vital Sign    Worried About Running Out of Food in the Last Year: Never true    Ran Out of Food in the Last Year: Never true  Transportation Needs: No Transportation Needs (07/08/2022)   PRAPARE - Administrator, Civil Service  (Medical): No    Lack of Transportation (Non-Medical): No  Physical Activity: Insufficiently Active (07/08/2022)   Exercise Vital Sign    Days of Exercise per Week: 2 days    Minutes of Exercise per Session: 30 min  Stress: No Stress Concern Present (07/08/2022)   [employer] of Occupational Health - Occupational Stress Questionnaire    Feeling of Stress : Only a little  Social Connections: Moderately Isolated (07/08/2022)   Social Connection and Isolation Panel [NHANES]    Frequency of Communication with Friends and Family: More than three times a week    Frequency of Social Gatherings with Friends and Family: Once a week    Attends Religious Services: Never    Database administrator or Organizations: No    Attends Engineer, structural: Not on file    Marital Status: Married  Catering manager Violence: Unknown (10/27/2021)   Received from [employer], [facility]   HITS    Physically Hurt: Not on file    Insult or Talk Down To: Not on file    Threaten Physical Harm: Not on file    Scream or Curse: Not on file   Family History  Problem Relation Age of Onset   Anxiety disorder Mother    Asthma Mother    COPD Mother    Hypertension Mother    Hyperlipidemia Mother    Osteoporosis Mother    Diabetes Mother    Irritable bowel syndrome Mother    Heart disease Father    Heart attack Father    Bipolar disorder Sister    Schizophrenia Sister    Drug abuse Sister    Drug abuse Brother    Anxiety disorder Brother    Depression Brother    Thyroid disease Brother    Hyperlipidemia Maternal Grandmother    Hypertension Maternal Grandmother    Stroke Maternal Grandmother    Alzheimer's disease Maternal Grandmother    Cancer Maternal Grandfather        lung   Heart disease Maternal Grandfather    Kidney disease Paternal Grandmother    Cancer Paternal Grandfather    Asthma Son    Colon cancer Neg Hx    Inflammatory bowel disease Neg Hx    Celiac disease Neg Hx      Objective: Office vital signs reviewed. BP 124/86   Pulse 74   Temp 98.5 F (36.9 C)   Ht 5\' 5"  (1.651 m)   Wt 142 lb (64.4 kg)   LMP 09/08/2022   SpO2 96%   BMI 23.63 kg/m   Physical Examination:  General: Awake, alert, well nourished, No acute distress HEENT: Well-healed postsurgical scar anteriorly on the neck.  No exophthalmos.  No goiter.  Xanthelasma noted in the  upper inner corners of eyelids bilaterally Cardio: regular rate and rhythm, S1S2 heard, no murmurs appreciated Pulm: clear to auscultation bilaterally, no wheezes, rhonchi or rales; normal work of breathing on room air GI: soft, non-tender, non-distended, bowel sounds present x4, no hepatomegaly, no splenomegaly, no masses    Assessment/ Plan: 42 y.o. female   GAD (generalized anxiety disorder)  Depression, major, single episode, mild (HCC)  Pure hypercholesterolemia - Plan: Hepatic function panel, Lipid panel  Xanthelasma of eyelid, bilateral - Plan: Hepatic function panel, Lipid panel  Screening for HIV without presence of risk factors - Plan: HIV antibody (with reflex)  Encounter for hepatitis C screening test for low risk patient - Plan: Hepatitis C antibody  Irritable bowel syndrome, unspecified type - Plan: dicyclomine (BENTYL) 10 MG capsule  Anxiety disorder is well-controlled with Pristiq.  Very sparing use of Atarax.  No changes needed at this time  Recheck function panel, lipid panel.  Tolerating statin without difficulty.  Persistent xanthelasmas on the right upper inner eyelids noted  Screen for HIV and hepatitis C though no known exposures.  Never had blood transfused  Bentyl prescribed.  Use only as directed only as needed  No orders of the defined types were placed in this encounter.  No orders of the defined types were placed in this encounter.  Tetanus shot updated today   Raliegh Ip, DO [facility] [phone]

## 2022-10-21 DIAGNOSIS — E785 Hyperlipidemia, unspecified: Secondary | ICD-10-CM | POA: Diagnosis not present

## 2022-10-21 DIAGNOSIS — J45990 Exercise induced bronchospasm: Secondary | ICD-10-CM | POA: Diagnosis not present

## 2022-10-21 DIAGNOSIS — Z85850 Personal history of malignant neoplasm of thyroid: Secondary | ICD-10-CM | POA: Diagnosis not present

## 2022-10-21 DIAGNOSIS — K581 Irritable bowel syndrome with constipation: Secondary | ICD-10-CM | POA: Diagnosis not present

## 2022-10-21 DIAGNOSIS — I10 Essential (primary) hypertension: Secondary | ICD-10-CM | POA: Diagnosis not present

## 2022-10-21 DIAGNOSIS — Z8249 Family history of ischemic heart disease and other diseases of the circulatory system: Secondary | ICD-10-CM | POA: Diagnosis not present

## 2022-10-21 DIAGNOSIS — E890 Postprocedural hypothyroidism: Secondary | ICD-10-CM | POA: Diagnosis not present

## 2022-10-21 DIAGNOSIS — F419 Anxiety disorder, unspecified: Secondary | ICD-10-CM | POA: Diagnosis not present

## 2022-10-21 DIAGNOSIS — Z818 Family history of other mental and behavioral disorders: Secondary | ICD-10-CM | POA: Diagnosis not present

## 2022-10-21 DIAGNOSIS — F320 Major depressive disorder, single episode, mild: Secondary | ICD-10-CM | POA: Diagnosis not present

## 2022-11-04 ENCOUNTER — Other Ambulatory Visit: Payer: Self-pay | Admitting: "Endocrinology

## 2022-11-16 IMAGING — US US THYROID
1 series · 14 of 25 positions shown · non-contrast
Comparison: 07/09/2019

CLINICAL DATA: Status post total thyroidectomy for left-sided
papillary thyroid carcinoma.

EXAM:
ULTRASOUND OF HEAD/NECK SOFT TISSUES
TECHNIQUE: Ultrasound examination of the head and neck soft tissues was
performed in the area of clinical concern.

[Series 1: us thyroid · 14 of 34 slices shown]
[im 1/34]
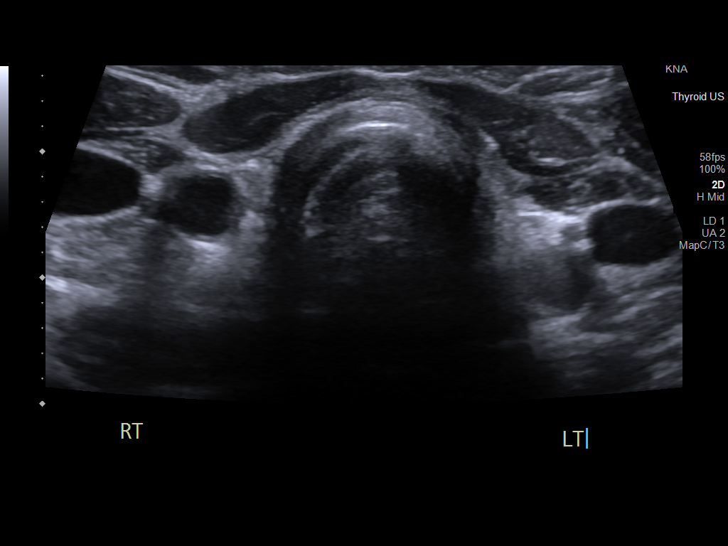
[im 3/34]
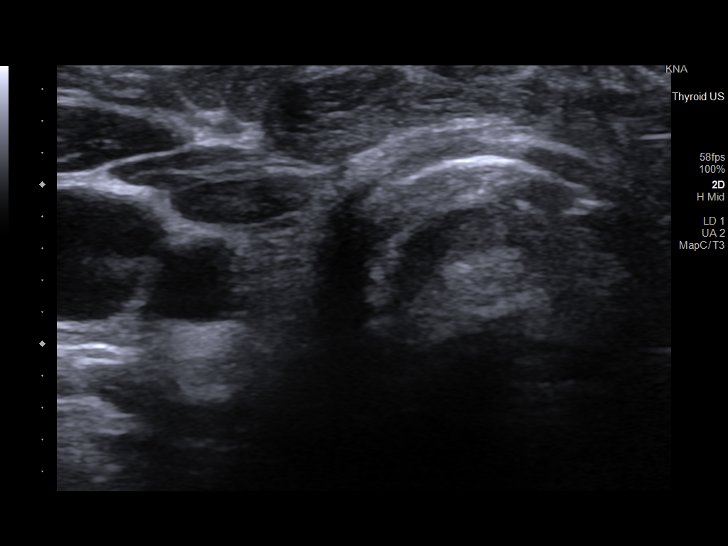
[im 6/34]
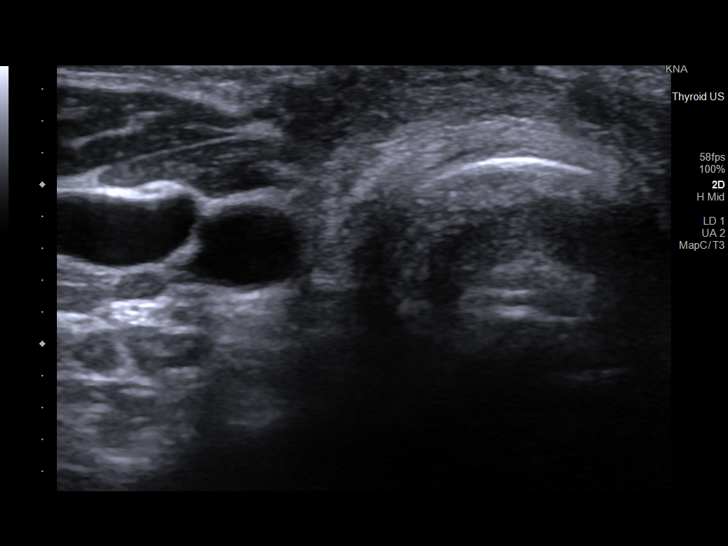
[im 9/34]
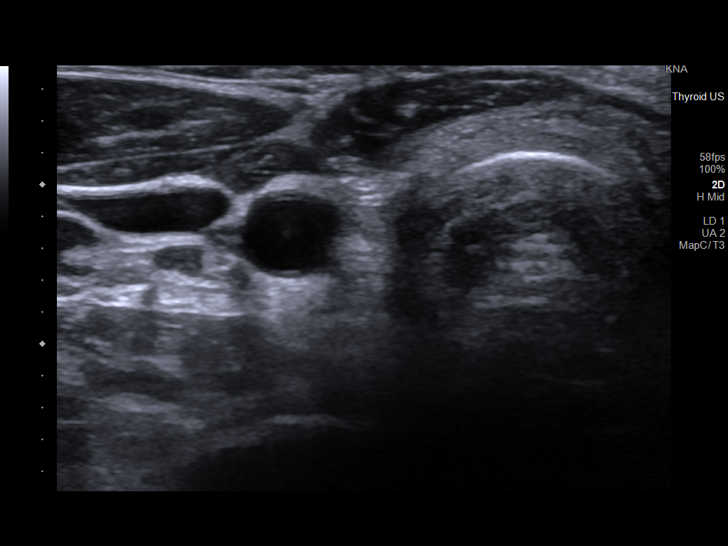
[im 12/34]
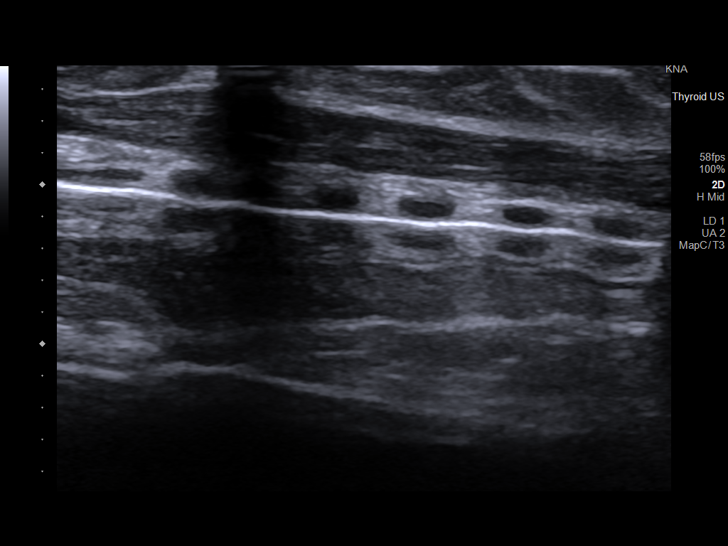
[im 13/34]
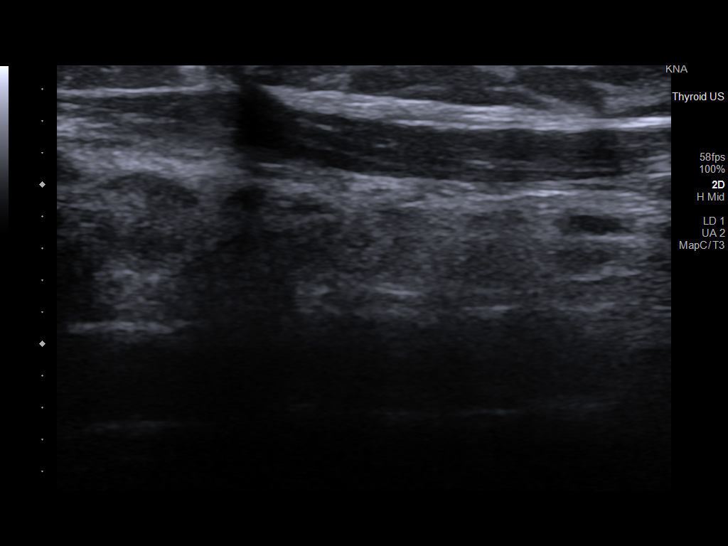
[im 16/34]
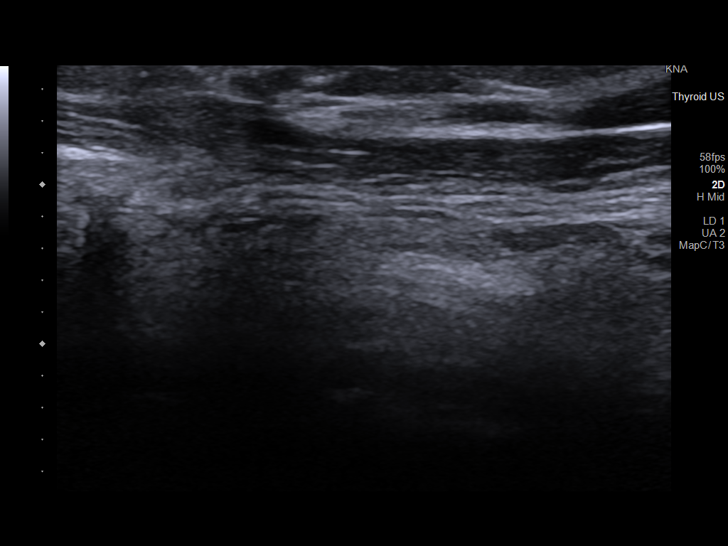
[im 18/34]
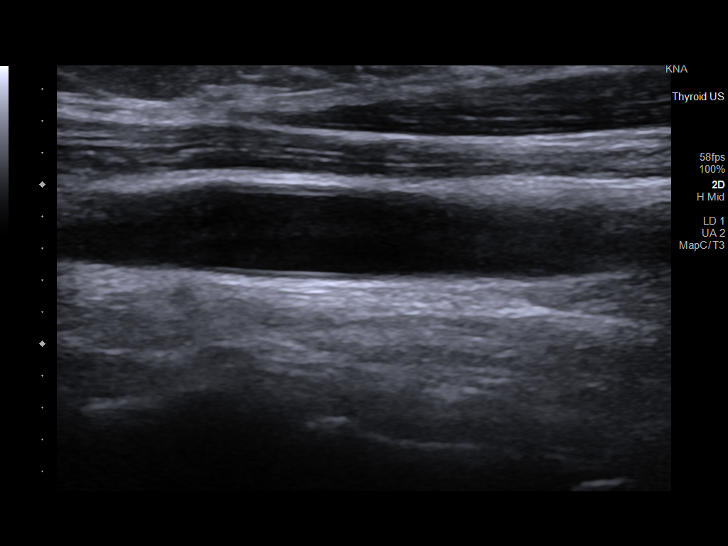
[im 21/34]
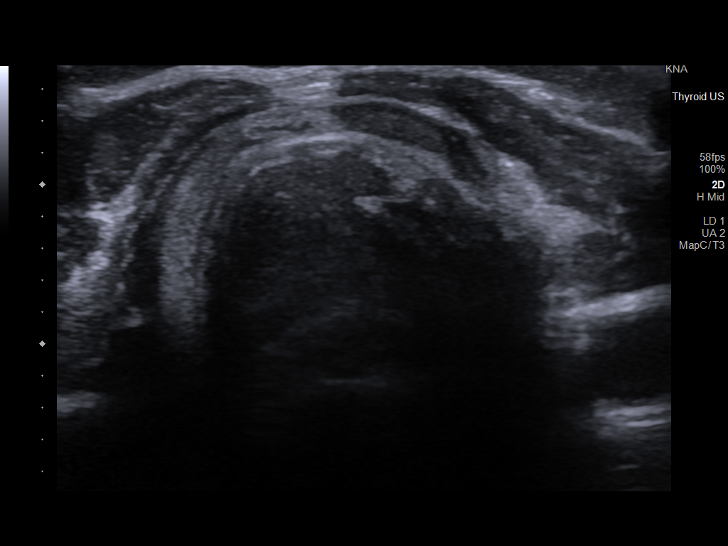
[im 23/34]
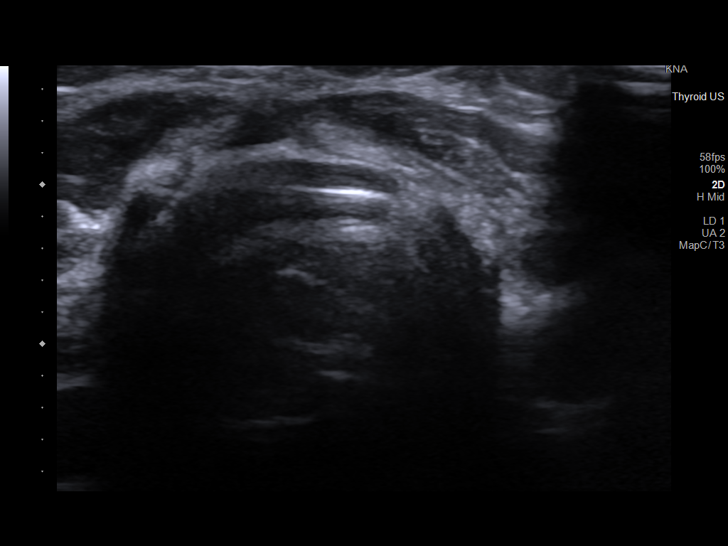
[im 25/34]
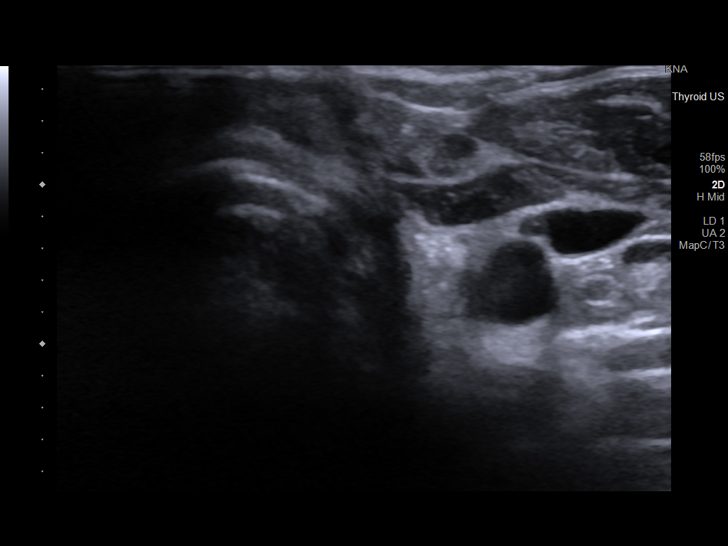
[im 28/34]
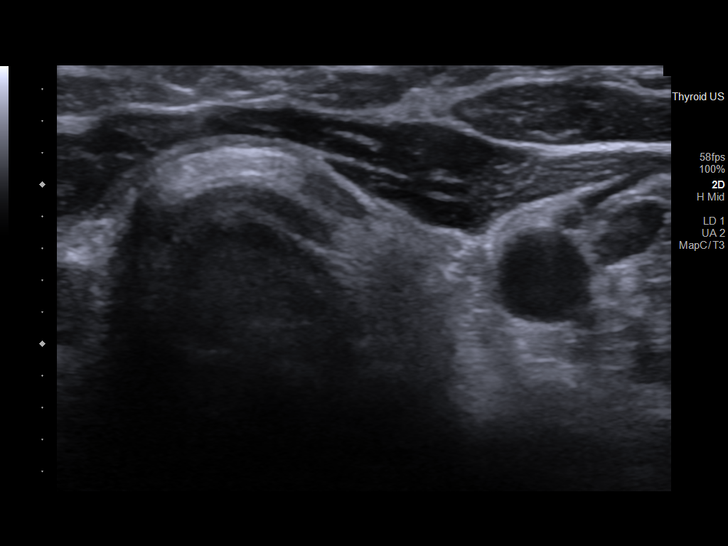
[im 31/34]
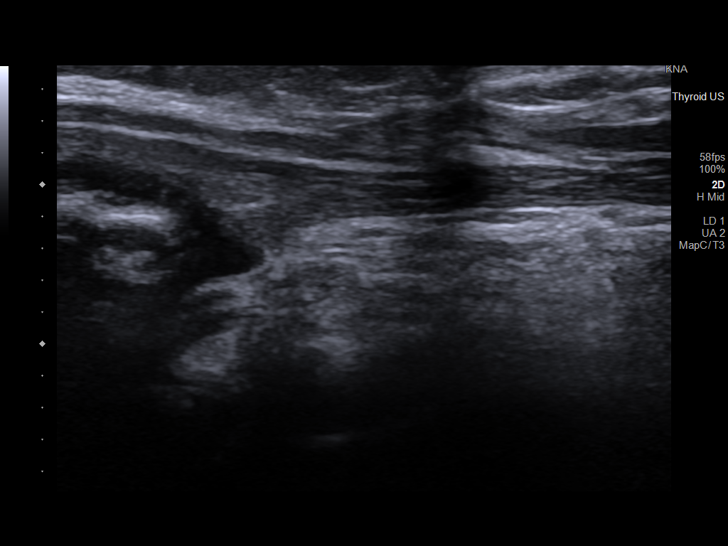
[im 34/34]
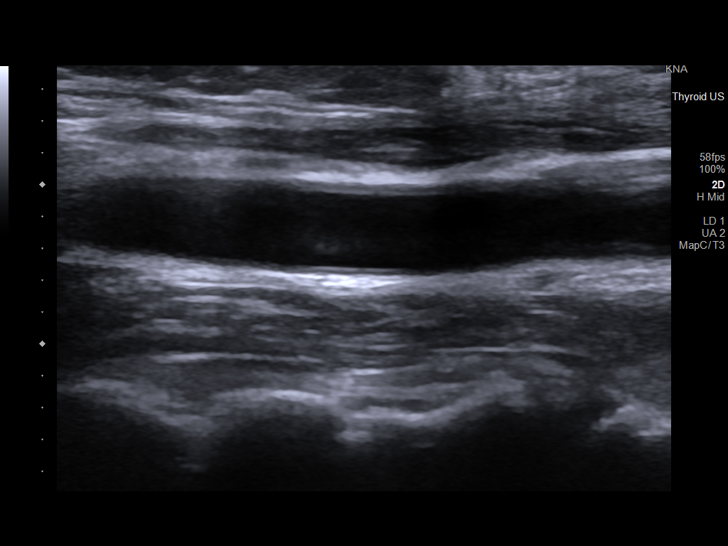

[14 of 25 positions shown; findings below may reference images not displayed]

FINDINGS: Ultrasound of the neck demonstrates no visible residual thyroid
tissue or abnormal soft tissue nodules. No abnormal lymph nodes
identified by ultrasound.
IMPRESSION: No evidence of residual thyroid tissue or abnormal soft tissue in
the neck status post thyroidectomy. No lymphadenopathy detected by
ultrasound.

## 2022-12-18 ENCOUNTER — Ambulatory Visit (HOSPITAL_COMMUNITY)
Admission: RE | Admit: 2022-12-18 | Discharge: 2022-12-18 | Disposition: A | Discharge status: Home / Self Care | Payer: 59 | Source: Ambulatory Visit | Attending: "Endocrinology | Admitting: "Endocrinology

## 2022-12-18 DIAGNOSIS — Z9089 Acquired absence of other organs: Secondary | ICD-10-CM | POA: Diagnosis not present

## 2022-12-18 DIAGNOSIS — C73 Malignant neoplasm of thyroid gland: Secondary | ICD-10-CM | POA: Insufficient documentation

## 2022-12-18 DIAGNOSIS — Z85850 Personal history of malignant neoplasm of thyroid: Secondary | ICD-10-CM | POA: Diagnosis not present

## 2022-12-26 ENCOUNTER — Other Ambulatory Visit: Payer: Self-pay | Admitting: Family Medicine

## 2023-01-06 ENCOUNTER — Encounter: Payer: Self-pay | Admitting: Family Medicine

## 2023-01-07 ENCOUNTER — Ambulatory Visit (INDEPENDENT_AMBULATORY_CARE_PROVIDER_SITE_OTHER): Payer: 59 | Admitting: Family Medicine

## 2023-01-07 ENCOUNTER — Encounter: Payer: Self-pay | Admitting: Family Medicine

## 2023-01-07 VITALS — BP 123/80 | HR 71 | Temp 98.3°F | Ht 65.0 in | Wt 146.0 lb

## 2023-01-07 DIAGNOSIS — Z0000 Encounter for general adult medical examination without abnormal findings: Secondary | ICD-10-CM

## 2023-01-07 DIAGNOSIS — Z0001 Encounter for general adult medical examination with abnormal findings: Secondary | ICD-10-CM

## 2023-01-07 DIAGNOSIS — H0264 Xanthelasma of left upper eyelid: Secondary | ICD-10-CM | POA: Diagnosis not present

## 2023-01-07 DIAGNOSIS — E890 Postprocedural hypothyroidism: Secondary | ICD-10-CM | POA: Diagnosis not present

## 2023-01-07 DIAGNOSIS — E7800 Pure hypercholesterolemia, unspecified: Secondary | ICD-10-CM

## 2023-01-07 DIAGNOSIS — C73 Malignant neoplasm of thyroid gland: Secondary | ICD-10-CM | POA: Diagnosis not present

## 2023-01-07 DIAGNOSIS — H0261 Xanthelasma of right upper eyelid: Secondary | ICD-10-CM | POA: Diagnosis not present

## 2023-01-07 DIAGNOSIS — L659 Nonscarring hair loss, unspecified: Secondary | ICD-10-CM

## 2023-01-07 NOTE — Progress Notes (Signed)
Brenda [name] is a 42 y.o. female presents to office today for annual physical exam examination.    Concerns today include: 1.  Hair thinning She has been having some hair loss for the last several years.  She notes its mostly in the temporal aspects of the scalp.  Not sure if there are any supplements that she should be taking.  She has a history of thyroid disorder but this is controlled with medications now.  Sometimes the hair thinning is more than others.  Has not tried any topicals.  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 occasional marijuana (once weekly)   Sexual activity: Yes    Birth control/protection: Surgical    Comment: tubal  Other Topics Concern   Not on file  Social History Narrative   Not on file   Social Determinants of Health   Financial Resource Strain: Low Risk  (07/08/2022)   Overall Financial Resource Strain (CARDIA)    Difficulty of Paying Living Expenses: Not very hard  Food Insecurity: No Food Insecurity (07/08/2022)   Hunger Vital Sign    Worried About Running Out of Food in the Last Year: Never true    Ran Out of Food in the  Last Year: Never true  Transportation Needs: No Transportation Needs (07/08/2022)   PRAPARE - Administrator, Civil Service (Medical): No    Lack of Transportation (Non-Medical): No  Physical Activity: Insufficiently Active (07/08/2022)   Exercise Vital Sign    Days of Exercise per Week: 2 days    Minutes of Exercise per Session: 30 min  Stress: No Stress Concern Present (07/08/2022)   [employer] of Occupational Health - Occupational Stress Questionnaire    Feeling of Stress : Only a little  Social Connections: Moderately Isolated (07/08/2022)   Social Connection and Isolation Panel [NHANES]    Frequency of Communication with Friends and Family: More than three times a week    Frequency of Social Gatherings with Friends and Family: Once a week    Attends Religious Services: Never    Database administrator or Organizations: No    Attends Engineer, structural: Not on file    Marital Status: Married  Intimate Partner Violence: Unknown (10/27/2021)   Received from [employer], [facility]   HITS    Physically Hurt: Not on file    Insult or Talk Down To: Not on file    Threaten Physical Harm: Not on file    Scream or Curse: Not on file  Past Surgical History:  Procedure Laterality Date   APPENDECTOMY     THYROIDECTOMY Left 09/14/2019   Procedure: LEFT HEMITHYROIDECTOMY;  Surgeon: Newman Pies, MD;  Location: [facility];  Service: ENT;  Laterality: Left;   Family History  Problem Relation Age of Onset   Anxiety disorder Mother    Asthma Mother    COPD Mother    Hypertension Mother    Hyperlipidemia Mother    Osteoporosis Mother    Diabetes Mother    Irritable bowel syndrome Mother    Heart disease Father    Heart attack Father    Bipolar disorder Sister    Schizophrenia Sister    Drug abuse Sister    Drug abuse Brother    Anxiety disorder Brother    Depression Brother    Thyroid disease Brother    Hyperlipidemia Maternal Grandmother     Hypertension Maternal Grandmother    Stroke Maternal Grandmother    Alzheimer's disease Maternal Grandmother    Cancer Maternal Grandfather        lung   Heart disease Maternal Grandfather    Kidney disease Paternal Grandmother    Cancer Paternal Grandfather    Asthma Son    Colon cancer Neg Hx    Inflammatory bowel disease Neg Hx    Celiac disease Neg Hx     Current Outpatient Medications:    albuterol (VENTOLIN HFA) 108 (90 Base) MCG/ACT inhaler, USE 2 PUFFS EVERY 6 HOURS AS NEEDED, Disp: 8.5 g, Rfl: 0   desvenlafaxine (PRISTIQ) 50 MG 24 hr tablet, Take 1 tablet (50 mg total) by mouth daily., Disp: 90 tablet, Rfl: 3   dicyclomine (BENTYL) 10 MG capsule, Take 10mg  daily before meals and at bedtime as needed., Disp: 60 capsule, Rfl: 5   diphenoxylate-atropine (LOMOTIL) 2.5-0.025 MG tablet, Take 2 tablets by mouth 4 (four) times daily as needed for diarrhea or loose stools., Disp: 30 tablet, Rfl: 0   hydrOXYzine (ATARAX) 25 MG tablet, Take 0.5-1 tablets (12.5-25 mg total) by mouth at bedtime as needed for anxiety (or sleep)., Disp: 30 tablet, Rfl: 0   levothyroxine (SYNTHROID) 100 MCG tablet, TAKE 1 TABLET BY MOUTH EVERY MORNING BEFORE BREAKFAST, Disp: 90 tablet, Rfl: 1   ondansetron (ZOFRAN-ODT) 8 MG disintegrating tablet, Take 1 tablet (8 mg total) by mouth every 6 (six) hours as needed for nausea or vomiting., Disp: 20 tablet, Rfl: 1   polyethylene glycol powder (MIRALAX) 17 GM/SCOOP powder, Take one capful daily if no bowel movement within past 48 hours. Can take once daily if needed., Disp: 255 g, Rfl: 0   promethazine (PHENERGAN) 25 MG suppository, Place rectally., Disp: , Rfl:    rosuvastatin (CRESTOR) 10 MG tablet, Take 1 tablet (10 mg total) by mouth daily., Disp: 90 tablet, Rfl: 3  Allergies  Allergen Reactions   Milk-Related Compounds Nausea And Vomiting    Vomiting and sweating     ROS: Review of Systems Pertinent items noted in HPI and remainder of comprehensive ROS  otherwise negative.    Physical exam BP 123/80   Pulse 71   Temp 98.3 F (36.8 C)   Ht 5\' 5"  (1.651 m)   Wt 146 lb (66.2 kg)   SpO2 100%   BMI 24.30 kg/m  General appearance: alert, cooperative, appears stated age, and no distress smells of marijuana in the room head: Normocephalic, without obvious abnormality, atraumatic, minimal bitemporal thinning noted.  No overt patches of alopecia . Eyes: negative findings: lids and lashes normal,  conjunctivae and sclerae normal, corneas clear, and pupils equal, round, reactive to light and accomodation xanthelasmas noted along the medial corners of bilateral upper eyelids ears: normal TM's and external ear canals both ears Nose: Nares normal. Septum midline. Mucosa normal. No drainage or sinus tenderness. Throat: lips, mucosa, and tongue normal; teeth and gums normal Neck: no adenopathy, supple, symmetrical, trachea midline, and thyroid not enlarged, symmetric, no tenderness/mass/nodules Back: symmetric, no curvature. ROM normal. No CVA tenderness. Lungs: clear to auscultation bilaterally Heart: regular rate and rhythm, S1, S2 normal, no murmur, click, rub or gallop Abdomen: soft, non-tender; bowel sounds normal; no masses,  no organomegaly Extremities: extremities normal, atraumatic, no cyanosis or edema Pulses: 2+ and symmetric Skin: Skin color, texture, turgor normal. No rashes or lesions Lymph nodes: Cervical, supraclavicular, and axillary nodes normal. Neurologic: Grossly normal      01/07/2023   11:36 AM 10/07/2022    8:40 AM 07/08/2022    1:09 PM  Depression screen PHQ 2/9  Decreased Interest 0 0 1  Down, Depressed, Hopeless 0 1 0  PHQ - 2 Score 0 1 1  Altered sleeping 0 0 1  Tired, decreased energy 0 1 1  Change in appetite 0 1 0  Feeling bad or failure about yourself  0 1 1  Trouble concentrating 0 0 0  Moving slowly or fidgety/restless 0 0 0  Suicidal thoughts 0 0 0  PHQ-9 Score 0 4 4  Difficult doing work/chores Not  difficult at all Not difficult at all Not difficult at all      01/07/2023   11:36 AM 10/07/2022    8:41 AM 07/08/2022    1:09 PM 05/17/2022    3:15 PM  GAD 7 : Generalized Anxiety Score  Nervous, Anxious, on Edge 0 1 1 1   Control/stop worrying 0 1 0 1  Worry too much - different things 0 1 1 1   Trouble relaxing 0 1 0 1  Restless 0 0 0 0  Easily annoyed or irritable 0 0 0 0  Afraid - awful might happen 0 1 0 0  Total GAD 7 Score 0 5 2 4   Anxiety Difficulty Not difficult at all Not difficult at all Not difficult at all      Assessment/ Plan: Noah Charon here for annual physical exam.   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  She will continue statin.  Xanthelasmas actually look a bit flatter than previous so I hope that these might resolve for her  With regards to hair thinning, we discussed that it could in fact be related to thyroid disorder but given that this is controlled this may be simply due to aging and hormonal imbalance.  We discussed that as long as she is not family-planning I see no reason she cannot utilize something like Rogaine topically.  If she desires going forward I am glad to place referral to hair restoration specialist.  Counseled on healthy lifestyle choices, including diet (rich in fruits, vegetables and lean meats and low in salt and simple carbohydrates) and exercise (at least 30 minutes of moderate physical activity daily).  Patient to follow up 1 year for CPE  Fernande Treiber M. Nadine Counts, DO

## 2023-01-08 LAB — HEPATIC FUNCTION PANEL
ALT: 33 [IU]/L — ABNORMAL HIGH (ref 0–32)
AST: 27 [IU]/L (ref 0–40)
Albumin: 4.4 g/dL (ref 3.9–4.9)
Alkaline Phosphatase: 73 [IU]/L (ref 44–121)
Bilirubin Total: 0.6 mg/dL (ref 0.0–1.2)
Bilirubin, Direct: 0.18 mg/dL (ref 0.00–0.40)
Total Protein: 6.9 g/dL (ref 6.0–8.5)

## 2023-01-08 LAB — LIPID PANEL
Chol/HDL Ratio: 2.8 ratio (ref 0.0–4.4)
Cholesterol, Total: 126 mg/dL (ref 100–199)
HDL: 45 mg/dL (ref >39)
LDL Chol Calc (NIH): 63 mg/dL (ref 0–99)
Triglycerides: 92 mg/dL (ref 0–149)
VLDL Cholesterol Cal: 18 mg/dL (ref 5–40)

## 2023-01-16 LAB — T4, FREE: Free T4: 1.83 ng/dL — ABNORMAL HIGH (ref 0.82–1.77)

## 2023-01-16 LAB — THYROGLOBULIN ANTIBODY: Thyroglobulin Antibody: <1 [IU]/mL (ref 0.0–0.9)

## 2023-01-16 LAB — THYROGLOBULIN LEVEL: Thyroglobulin (TG-RIA): <2 ng/mL

## 2023-01-16 LAB — TSH: TSH: 0.667 u[IU]/mL (ref 0.450–4.500)

## 2023-01-30 ENCOUNTER — Encounter: Payer: Self-pay | Admitting: "Endocrinology

## 2023-01-30 ENCOUNTER — Ambulatory Visit: Payer: 59 | Admitting: "Endocrinology

## 2023-01-30 VITALS — BP 118/82 | HR 64 | Ht 65.0 in | Wt 146.2 lb

## 2023-01-30 DIAGNOSIS — E782 Mixed hyperlipidemia: Secondary | ICD-10-CM | POA: Diagnosis not present

## 2023-01-30 DIAGNOSIS — F17200 Nicotine dependence, unspecified, uncomplicated: Secondary | ICD-10-CM | POA: Insufficient documentation

## 2023-01-30 DIAGNOSIS — C73 Malignant neoplasm of thyroid gland: Secondary | ICD-10-CM | POA: Diagnosis not present

## 2023-01-30 DIAGNOSIS — E890 Postprocedural hypothyroidism: Secondary | ICD-10-CM

## 2023-01-30 MED ORDER — LEVOTHYROXINE SODIUM 100 MCG PO TABS: ORAL_TABLET | ORAL | 1 refills | Status: DC

## 2023-01-30 NOTE — Progress Notes (Signed)
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   Past Surgical History:  Procedure Laterality Date   APPENDECTOMY     THYROIDECTOMY Left 09/14/2019   Procedure: LEFT HEMITHYROIDECTOMY;  Surgeon: Newman Pies, MD;  Location: [facility];  Service: ENT;  Laterality: Left;   Social History   Socioeconomic History   Marital status: Married    Spouse name: [place]   Number of children: 2   Years of education: some college   Highest education level: Some college, no degree  Occupational History    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 occasional marijuana (once weekly)   Sexual activity: Yes    Birth control/protection: Surgical    Comment: tubal  Other Topics Concern   Not on file  Social History Narrative   Not on file   Social Determinants of Health   Financial Resource Strain: Low Risk  (07/08/2022)   Overall Financial Resource Strain (CARDIA)    Difficulty of Paying Living Expenses: Not very hard  Food Insecurity: No Food Insecurity (07/08/2022)   Hunger Vital Sign    Worried About Running Out of Food in the Last Year: Never true    Ran Out of Food in the Last Year: Never true  Transportation Needs: No Transportation Needs (07/08/2022)   PRAPARE - Administrator, Civil Service (Medical): No    Lack of Transportation (Non-Medical): No  Physical Activity: Insufficiently Active (07/08/2022)   Exercise Vital Sign     Days of Exercise per Week: 2 days    Minutes of Exercise per Session: 30 min  Stress: No Stress Concern Present (07/08/2022)   [employer] of Occupational Health - Occupational Stress Questionnaire    Feeling of Stress : Only a little  Social Connections: Moderately Isolated (07/08/2022)   Social Connection and Isolation Panel [NHANES]    Frequency of Communication with Friends and Family: More than three times a week    Frequency of Social Gatherings with Friends and Family: Once a week    Attends Religious Services: Never    Database administrator or Organizations: No    Attends Engineer, structural: Not on file    Marital Status: Married   Family History  Problem Relation Age of Onset   Anxiety disorder Mother    Asthma Mother    COPD Mother    Hypertension Mother    Hyperlipidemia Mother    Osteoporosis Mother    Diabetes Mother    Irritable bowel syndrome Mother    Heart disease Father    Heart attack Father  Bipolar disorder Sister    Schizophrenia Sister    Drug abuse Sister    Drug abuse Brother    Anxiety disorder Brother    Depression Brother    Thyroid disease Brother    Hyperlipidemia Maternal Grandmother    Hypertension Maternal Grandmother    Stroke Maternal Grandmother    Alzheimer's disease Maternal Grandmother    Cancer Maternal Grandfather        lung   Heart disease Maternal Grandfather    Kidney disease Paternal Grandmother    Cancer Paternal Grandfather    Asthma Son    Colon cancer Neg Hx    Inflammatory bowel disease Neg Hx    Celiac disease Neg Hx    Outpatient Encounter Medications as of 01/30/2023  Medication Sig   albuterol (VENTOLIN HFA) 108 (90 Base) MCG/ACT inhaler USE 2 PUFFS EVERY 6 HOURS AS NEEDED   desvenlafaxine (PRISTIQ) 50 MG 24 hr tablet Take 1 tablet (50 mg total) by mouth daily.   dicyclomine (BENTYL) 10 MG capsule Take 10mg  daily before meals and at bedtime as needed.   diphenoxylate-atropine (LOMOTIL)  2.5-0.025 MG tablet Take 2 tablets by mouth 4 (four) times daily as needed for diarrhea or loose stools.   hydrOXYzine (ATARAX) 25 MG tablet Take 0.5-1 tablets (12.5-25 mg total) by mouth at bedtime as needed for anxiety (or sleep).   levothyroxine (SYNTHROID) 100 MCG tablet TAKE 1 TABLET BY MOUTH EVERY MORNING BEFORE BREAKFAST   ondansetron (ZOFRAN-ODT) 8 MG disintegrating tablet Take 1 tablet (8 mg total) by mouth every 6 (six) hours as needed for nausea or vomiting.   polyethylene glycol powder (MIRALAX) 17 GM/SCOOP powder Take one capful daily if no bowel movement within past 48 hours. Can take once daily if needed.   promethazine (PHENERGAN) 25 MG suppository Place rectally.   rosuvastatin (CRESTOR) 10 MG tablet Take 1 tablet (10 mg total) by mouth daily.   [DISCONTINUED] levothyroxine (SYNTHROID) 100 MCG tablet TAKE 1 TABLET BY MOUTH EVERY MORNING BEFORE BREAKFAST   No facility-administered encounter medications on file as of 01/30/2023.   ALLERGIES: Allergies  Allergen Reactions   Milk-Related Compounds Nausea And Vomiting    Vomiting and sweating    VACCINATION STATUS: Immunization History  Administered Date(s) Administered   Tdap 10/07/2022    HPI Brenda [name] is 42 y.o. female who presents today to follow-up for postsurgical hypothyroidism and history of thyroid malignancy.  She underwent total thyroidectomy for thyroid cancer.  She is status post Thyrogen stimulated I-131 remnant ablation with post therapy whole-body scan.  See below.  -PMD:  Raliegh Ip, DO.    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 A sample was sent for Afirma molecular study which was significant for  > 99% risk of malignancy reported on Aug 03, 2019. 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 -She underwent I-131 thyroid remnant ablation on November 19, 2019 with whole-body scan on November 29, 2019 revealing significant uptake in the neck, inconclusive for distant metastasis.  Her surveillance thyroid/neck ultrasound in April 2022 was  negative for any thyroid remnant or residual nor lymphadenopathy.   Her previsit thyroid and stimulated whole-body scan is consistent with treatment effect, no evidence of recurrence or  metastasis.  Her most recent previsit thyroid/neck ultrasound on December 18, 2022 was also unremarkable.  Patient has done very well following her surgery, required calcium supplement briefly.  She remains on levothyroxine 100 mcg p.o. daily before breakfast.  Her previsit thyroid function tests are consistent with appropriate replacement.   She reports compliance and consistency to his medication.  Her previsit labs are consistent with appropriate replacement.    She denies any family history of thyroid malignancy, however some thyroid dysfunction in a half brother. She denies cigarette smoking, however marijuana smoker. She denies dysphagia, shortness of breath, voice change. She denies palpitations, tremors, nor heat/cold intolerance. She reports steady body weight.  She is also on low-dose potassium supplement for mild hypokalemia.  Review of Systems She has steady weight.  Otherwise limited as above.  Objective:       01/30/2023    3:51 PM 01/07/2023   11:35 AM 10/07/2022    8:36 AM  Vitals with BMI  Height 5\' 5"  5\' 5"  5\' 5"   Weight 146 lbs 3 oz 146 lbs 142 lbs  BMI 24.33 24.3 23.63  Systolic 118 123 119  Diastolic 82 80 86  Pulse 64 71 74    BP 118/82   Pulse 64   Ht 5\' 5"  (1.651 m)   Wt 146 lb 3.2 oz (66.3 kg)   BMI 24.33 kg/m   Wt Readings from Last 3 Encounters:  01/30/23 146 lb 3.2 oz (66.3 kg)  01/07/23 146 lb (66.2 kg)  10/07/22 142 lb (64.4 kg)     Physical Exam   CMP ( most recent) CMP     Component Value Date/Time   NA 137 05/23/2022 1036   K 4.3 05/23/2022 1036   CL 101 05/23/2022 1036   CO2 21 05/23/2022 1036   GLUCOSE 93 05/23/2022 1036   GLUCOSE 100 (H) 06/22/2021 0836   BUN 9 05/23/2022 1036   CREATININE 0.78 05/23/2022 1036   CALCIUM 9.7 05/23/2022 1036   PROT 6.9 01/07/2023 1210   ALBUMIN 4.4 01/07/2023 1210   AST 27 01/07/2023 1210   ALT 33 (H) 01/07/2023 1210   ALKPHOS 73 01/07/2023 1210   BILITOT 0.6 01/07/2023 1210   GFRNONAA >60 06/22/2021 0836   GFRAA 122 07/02/2019 1058     Lipid Panel ( most recent) Lipid Panel     Component Value Date/Time   CHOL 126 01/07/2023 1210   TRIG 92 01/07/2023 1210   HDL 45 01/07/2023 1210   CHOLHDL 2.8 01/07/2023 1210   LDLCALC 63 01/07/2023 1210   LABVLDL 18 01/07/2023 1210      Lab Results  Component Value Date   TSH 0.667 01/07/2023   TSH 1.260 07/08/2022   TSH 4.570 (H) 01/02/2022   TSH 151.484 (H) 06/22/2021   TSH 1.390 01/03/2021   TSH 11.000 (H) 11/27/2020   TSH 3.290 10/09/2020   TSH 2.630 06/21/2020   TSH 1.410 01/06/2020   TSH 117.553 (H) 11/19/2019   FREET4 1.83 (H) 01/07/2023   FREET4 1.63 07/08/2022   FREET4 1.38 01/02/2022   FREET4 1.21 (H) 06/22/2021   FREET4 1.33 01/03/2021   FREET4 1.48 11/27/2020   FREET4 1.88 (H) 10/09/2020   FREET4 1.20 06/21/2020   FREET4 1.51 01/06/2020   FREET4 1.21 (H) 11/19/2019      Assessment & Plan:   1. Postsurgical hypothyroidism 2. Malignant neoplasm of thyroid gland (HCC) -I reviewed  her current and existing labs and imaging studies with her. - I have reviewed her available  thyroid records and clinically evaluated the patient.  Reviewed her most recent thyroid testing with whole-body scan as well as her labs. 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   Pathologic Stage Classification (pTNM, AJCC 8th Edition): pT2, pN not  assigned (no nodes submitted or found)   -In light of her follicular variant PTC, stage 2 cancer, relative youth of the patient, she was offered  adjuvant therapy with I-131 thyroid remnant ablation. This treatment was administered on November 19, 2019.  Post therapy whole-body scan results are consistent with significant uptake in the neck, inconclusive for distant metastases-nondiagnostic due to the degree of uptake of tracer at the neck, despite shielding.     -Her surveillance   thyroid/neck ultrasound in April 2022 was negative for any remnant or residual nor lymphadenopathy.     She has recovered well from her surgery.  Her previsit thyroid stimulated whole-body scan completed on June 25, 2021 is consistent with treatment effect, no evidence of distant metastasis or recurrence.   Her previsit surveillance thyroid/neck ultrasound is unremarkable from December 18, 2022.  Her previsit thyroglobulin levels are also undetectable at  <1.0.  Her previsit thyroid function tests are consistent with appropriate replacement.  She is advised to continue her current dose of levothyroxine 100 mcg po daily before breakfast.   - We discussed about the correct intake of her thyroid hormone, on empty stomach at fasting, with water, separated by at least 30 minutes from breakfast and other medications,  and separated by more than 4 hours from calcium, iron, multivitamins, acid reflux medications (PPIs). -Patient is made aware of the fact that thyroid hormone replacement is needed for life, dose to be adjusted by periodic monitoring of thyroid function tests.  She has not taken calcium supplements since her last visit.  Her previsit labs show calcium of 9.7.  She took herself off of the Os-Cal.  Her CMP also shows normalization of potassium at 4.3 status post supplement with potassium  chloride.  Hyperlipidemia- she initiated on Crestor 10 mg po qhs. Advised to continue.  The patient was counseled on the dangers of tobacco use, and was advised to quit.  Reviewed strategies to maximize success, including removing cigarettes and smoking materials from environment.   - she is advised to maintain close follow up with Raliegh Ip, DO for primary care needs.         I spent  25 minutes in the care of the patient today including review of labs from Thyroid Function, CMP, and other relevant labs ; imaging/biopsy records (current and previous including abstractions from other facilities); face-to-face time discussing  her lab results and symptoms, medications doses, her options of short and long term treatment based on the latest standards of care / guidelines;   and documenting the encounter.  Brenda [name]  participated in the discussions, expressed understanding, and voiced agreement with the above plans.  All questions were answered to her satisfaction. she is encouraged to contact clinic should she have any questions or concerns prior to her return visit.    Follow up plan: Return in about 6 months (around 07/30/2023) for F/U with Pre-visit Labs.   Marquis Lunch, MD [facility] [address] [place], [place] [address]  Phone: [phone]  Fax: [phone]     01/30/2023, 5:17 PM  This note was partially dictated with voice recognition software. Similar sounding words can be transcribed inadequately or may not  be corrected upon review.

## 2023-02-27 ENCOUNTER — Other Ambulatory Visit: Payer: Self-pay | Admitting: Family Medicine

## 2023-06-09 ENCOUNTER — Other Ambulatory Visit: Payer: Self-pay | Admitting: Family Medicine

## 2023-06-09 DIAGNOSIS — Z1231 Encounter for screening mammogram for malignant neoplasm of breast: Secondary | ICD-10-CM

## 2023-06-13 ENCOUNTER — Encounter: Payer: Self-pay | Admitting: Family Medicine

## 2023-06-24 ENCOUNTER — Ambulatory Visit
Admission: RE | Admit: 2023-06-24 | Discharge: 2023-06-24 | Disposition: A | Discharge status: Home / Self Care | Source: Ambulatory Visit | Attending: Family Medicine | Admitting: Family Medicine

## 2023-06-24 DIAGNOSIS — Z1231 Encounter for screening mammogram for malignant neoplasm of breast: Secondary | ICD-10-CM | POA: Diagnosis not present

## 2023-07-23 ENCOUNTER — Other Ambulatory Visit

## 2023-07-23 DIAGNOSIS — E890 Postprocedural hypothyroidism: Secondary | ICD-10-CM | POA: Diagnosis not present

## 2023-07-23 DIAGNOSIS — C73 Malignant neoplasm of thyroid gland: Secondary | ICD-10-CM | POA: Diagnosis not present

## 2023-07-30 ENCOUNTER — Ambulatory Visit: Payer: 59 | Admitting: "Endocrinology

## 2023-07-30 ENCOUNTER — Encounter: Payer: Self-pay | Admitting: "Endocrinology

## 2023-07-30 VITALS — BP 112/74 | HR 68 | Ht 65.0 in | Wt 153.2 lb

## 2023-07-30 DIAGNOSIS — E782 Mixed hyperlipidemia: Secondary | ICD-10-CM

## 2023-07-30 DIAGNOSIS — F17200 Nicotine dependence, unspecified, uncomplicated: Secondary | ICD-10-CM

## 2023-07-30 DIAGNOSIS — C73 Malignant neoplasm of thyroid gland: Secondary | ICD-10-CM

## 2023-07-30 DIAGNOSIS — E890 Postprocedural hypothyroidism: Secondary | ICD-10-CM

## 2023-07-30 LAB — TSH: TSH: 1.75 u[IU]/mL (ref 0.450–4.500)

## 2023-07-30 LAB — THYROGLOBULIN ANTIBODY: Thyroglobulin Antibody: <1 [IU]/mL (ref 0.0–0.9)

## 2023-07-30 LAB — T4, FREE: Free T4: 1.56 ng/dL (ref 0.82–1.77)

## 2023-07-30 LAB — THYROGLOBULIN LEVEL: Thyroglobulin (TG-RIA): <2 ng/mL

## 2023-07-30 MED ORDER — LEVOTHYROXINE SODIUM 100 MCG PO TABS: ORAL_TABLET | ORAL | 1 refills | Status: DC

## 2023-07-30 NOTE — Progress Notes (Signed)
 07/30/2023, 9:15 AM      Endocrinology follow-up note   Subjective:    Patient ID: Brenda [name], female    DOB: 1980/12/05, PCP Eliodoro Guerin, DO   Past Medical History:  Diagnosis Date   Anxiety    Malignant neoplasm of thyroid  gland (HCC) 09/30/2019   Past Surgical History:  Procedure Laterality Date   APPENDECTOMY     THYROIDECTOMY Left 09/14/2019   Procedure: LEFT HEMITHYROIDECTOMY;  Surgeon: Reynold Caves, MD;  Location: [facility];  Service: ENT;  Laterality: Left;   Social History   Socioeconomic History   Marital status: Married    Spouse name: [place]   Number of children: 2   Years of education: some college   Highest education level: Some college, no degree  Occupational History    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 occasional marijuana (once weekly)   Sexual activity: Yes    Birth control/protection: Surgical    Comment: tubal  Other Topics Concern   Not on file  Social History Narrative   Not on file   Social Drivers of Health   Financial Resource Strain: Low Risk  (07/08/2022)   Overall Financial Resource Strain (CARDIA)    Difficulty of Paying Living Expenses: Not very hard  Food Insecurity: No Food Insecurity (07/08/2022)   Hunger Vital Sign    Worried About Running Out of Food in the Last Year: Never true    Ran Out of Food in the Last Year: Never true  Transportation Needs: No Transportation Needs (07/08/2022)   PRAPARE - Administrator, Civil Service (Medical): No    Lack of Transportation (Non-Medical): No  Physical Activity: Insufficiently Active (07/08/2022)   Exercise Vital Sign    Days of  Exercise per Week: 2 days    Minutes of Exercise per Session: 30 min  Stress: No Stress Concern Present (07/08/2022)   [employer] of Occupational Health - Occupational Stress Questionnaire    Feeling of Stress : Only a little  Social Connections: Moderately Isolated (07/08/2022)   Social Connection and Isolation Panel [NHANES]    Frequency of Communication with Friends and Family: More than three times a week    Frequency of Social Gatherings with Friends and Family: Once a week    Attends Religious Services: Never    Database administrator or Organizations: No    Attends Engineer, structural: Not on file    Marital Status: Married   Family History  Problem Relation Age of Onset   Anxiety disorder Mother    Asthma Mother    COPD Mother    Hypertension Mother    Hyperlipidemia Mother    Osteoporosis Mother    Diabetes Mother    Irritable bowel syndrome Mother    Heart disease Father    Heart attack Father  Bipolar disorder Sister    Schizophrenia Sister    Drug abuse Sister    Drug abuse Brother    Anxiety disorder Brother    Depression Brother    Thyroid  disease Brother    Hyperlipidemia Maternal Grandmother    Hypertension Maternal Grandmother    Stroke Maternal Grandmother    Alzheimer's disease Maternal Grandmother    Cancer Maternal Grandfather        lung   Heart disease Maternal Grandfather    Kidney disease Paternal Grandmother    Cancer Paternal Grandfather    Asthma Son    Colon cancer Neg Hx    Inflammatory bowel disease Neg Hx    Celiac disease Neg Hx    Outpatient Encounter Medications as of 07/30/2023  Medication Sig   albuterol  (VENTOLIN  HFA) 108 (90 Base) MCG/ACT inhaler USE 2 PUFFS EVERY 6 HOURS AS NEEDED   desvenlafaxine  (PRISTIQ ) 50 MG 24 hr tablet Take 1 tablet (50 mg total) by mouth daily.   dicyclomine  (BENTYL ) 10 MG capsule Take 10mg  daily before meals and at bedtime as needed.   diphenoxylate -atropine  (LOMOTIL ) 2.5-0.025 MG  tablet Take 2 tablets by mouth 4 (four) times daily as needed for diarrhea or loose stools.   hydrOXYzine  (ATARAX ) 25 MG tablet Take 0.5-1 tablets (12.5-25 mg total) by mouth at bedtime as needed for anxiety (or sleep).   levothyroxine  (SYNTHROID ) 100 MCG tablet TAKE 1 TABLET BY MOUTH EVERY MORNING BEFORE BREAKFAST   ondansetron  (ZOFRAN -ODT) 8 MG disintegrating tablet Take 1 tablet (8 mg total) by mouth every 6 (six) hours as needed for nausea or vomiting.   polyethylene glycol powder (MIRALAX ) 17 GM/SCOOP powder Take one capful daily if no bowel movement within past 48 hours. Can take once daily if needed.   promethazine  (PHENERGAN ) 25 MG suppository Place rectally.   rosuvastatin  (CRESTOR ) 10 MG tablet Take 1 tablet (10 mg total) by mouth daily.   [DISCONTINUED] levothyroxine  (SYNTHROID ) 100 MCG tablet TAKE 1 TABLET BY MOUTH EVERY MORNING BEFORE BREAKFAST   No facility-administered encounter medications on file as of 07/30/2023.   ALLERGIES: Allergies  Allergen Reactions   Milk-Related Compounds Nausea And Vomiting    Vomiting and sweating    VACCINATION STATUS: Immunization History  Administered Date(s) Administered   Tdap 10/07/2022    HPI [name] is 43 y.o. female who presents today to follow-up for postsurgical hypothyroidism and history of thyroid  malignancy.  She underwent total thyroidectomy for thyroid  cancer.  She is status post Thyrogen  stimulated I-131 remnant ablation with post therapy whole-body scan.  See below.  -PMD:  Eliodoro Guerin, DO.    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 A sample was sent for Afirma molecular study which was significant for  > 99% risk of malignancy reported on Aug 03, 2019. -She underwent left partial thyroidectomy on September 14, 2019 which showed 4 cm papillary thyroid  cancer on the left lobe, underwent completion thyroidectomy on September 21, 2019 with 0.9 cm focus of malignancy on the right lobe. -She underwent I-131 thyroid  remnant ablation on November 19, 2019 with whole-body scan on November 29, 2019 revealing significant uptake in the neck, inconclusive for distant metastasis.  Her surveillance thyroid /neck ultrasound in April 2022 was  negative for any thyroid  remnant or residual nor lymphadenopathy.   Her previsit thyroid  and stimulated whole-body scan is consistent with treatment effect, no evidence of recurrence or  metastasis.  Her most recent previsit thyroid /neck ultrasound on December 18, 2022 was also unremarkable. Thyroglobulin level was undetectable until her last visit in October 2024.  Her current labs included thyroglobulin, however results are pending.  Patient has done very well following her surgery, required calcium  supplement briefly.  She remains on levothyroxine  100 mcg p.o. daily before breakfast.  Her previsit thyroid  function tests are consistent with appropriate replacement.     She reports compliance and consistency to his medication.  She denies any family history of thyroid  malignancy, however some thyroid  dysfunction in a half brother. She continues to be marijuana smoker. She denies dysphagia, shortness of breath, voice change. She denies palpitations, tremors, nor heat/cold intolerance. She reports steady body weight.  Review of Systems She has steady weight.  Otherwise limited as above.  Objective:       07/30/2023    8:59 AM 01/30/2023    3:51 PM 01/07/2023   11:35 AM  Vitals with BMI  Height 5\' 5"  5\' 5"  5\' 5"   Weight 153 lbs 3 oz 146 lbs 3 oz 146 lbs  BMI 25.49 24.33 24.3  Systolic 112 118 161  Diastolic 74 82 80  Pulse 68 64 71    BP 112/74   Pulse 68   Ht 5\' 5"  (1.651 m)   Wt 153 lb 3.2 oz (69.5 kg)   BMI 25.49 kg/m   Wt Readings from Last 3 Encounters:  07/30/23 153 lb 3.2 oz (69.5 kg)  01/30/23 146 lb 3.2 oz (66.3 kg)  01/07/23 146 lb (66.2 kg)    Physical  Exam   CMP ( most recent) CMP     Component Value Date/Time   NA 137 05/23/2022 1036   K 4.3 05/23/2022 1036   CL 101 05/23/2022 1036   CO2 21 05/23/2022 1036   GLUCOSE 93 05/23/2022 1036   GLUCOSE 100 (H) 06/22/2021 0836   BUN 9 05/23/2022 1036   CREATININE 0.78 05/23/2022 1036   CALCIUM  9.7 05/23/2022 1036   PROT 6.9 01/07/2023 1210   ALBUMIN 4.4 01/07/2023 1210   AST 27 01/07/2023 1210   ALT 33 (H) 01/07/2023 1210   ALKPHOS 73 01/07/2023 1210   BILITOT 0.6 01/07/2023 1210   GFRNONAA >60 06/22/2021 0836   GFRAA 122 07/02/2019 1058     Lipid Panel ( most recent) Lipid Panel     Component Value Date/Time   CHOL 126 01/07/2023 1210   TRIG 92 01/07/2023 1210   HDL 45 01/07/2023 1210   CHOLHDL 2.8 01/07/2023 1210   LDLCALC 63 01/07/2023 1210   LABVLDL 18 01/07/2023 1210      Lab Results  Component Value Date   TSH 1.750 07/23/2023   TSH 0.667 01/07/2023   TSH 1.260 07/08/2022   TSH 4.570 (H) 01/02/2022   TSH 151.484 (H) 06/22/2021   TSH 1.390 01/03/2021   TSH 11.000 (H) 11/27/2020   TSH 3.290 10/09/2020   TSH 2.630 06/21/2020   TSH 1.410 01/06/2020   FREET4 1.56 07/23/2023   FREET4 1.83 (H) 01/07/2023   FREET4 1.63 07/08/2022   FREET4 1.38 01/02/2022   FREET4 1.21 (H) 06/22/2021   FREET4 1.33 01/03/2021   FREET4 1.48 11/27/2020   FREET4 1.88 (H) 10/09/2020   FREET4 1.20 06/21/2020   FREET4 1.51 01/06/2020      Assessment & Plan:   1. Postsurgical hypothyroidism 2. Malignant neoplasm of thyroid  gland (HCC) -I reviewed  her current and existing labs and imaging studies with her. - I have reviewed  her available thyroid  records and clinically evaluated the patient.  Reviewed her most recent thyroid  testing with whole-body scan as well as her labs. 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   Pathologic Stage Classification (pTNM, AJCC 8th Edition): pT2, pN not  assigned (no nodes submitted or found)   -In light of her follicular variant PTC, stage 2 cancer, relative youth of the patient, she was offered  adjuvant therapy with I-131 thyroid  remnant ablation. This treatment was administered on November 19, 2019.  Post therapy whole-body scan results are consistent with significant uptake in the neck, inconclusive for distant metastases-nondiagnostic due to the degree of uptake of tracer at the neck, despite shielding.     -Her surveillance   thyroid /neck ultrasound in April 2022 was negative for any remnant or residual nor lymphadenopathy.     She has recovered well from her surgery.  Her previsit thyroid  stimulated whole-body scan completed on June 25, 2021 is consistent with treatment effect, no evidence of distant metastasis or recurrence.   Her previsit surveillance thyroid /neck ultrasound is unremarkable from December 18, 2022.  Her previsit thyroglobulin levels are also undetectable at  <1.0.  Her current thyroglobulin's are pending. She will be considered for repeat Thyrogen  stimulated whole-body scan after her next visit.  Her previsit thyroid  function tests are consistent with appropriate replacement.  She is advised to continue levothyroxine  100 mcg p.o. daily before breakfast.     - We discussed about the correct intake of her thyroid  hormone, on empty stomach at fasting, with water , separated by at least 30 minutes from breakfast and other medications,  and separated by more than 4 hours from calcium , iron, multivitamins, acid reflux medications (PPIs). -Patient is made aware of the fact that thyroid  hormone replacement is needed for life, dose to be adjusted by periodic monitoring of thyroid  function tests.  Her last calcium  level without supplements was 9.7 mg/L.  She took herself off of the Os-Cal. Hyperlipidemia- she was initiated on Crestor  10  mg po qhs. Advised to continue.  The patient was counseled on the dangers of tobacco use, and was advised to quit.  Reviewed strategies to maximize success, including removing cigarettes and smoking materials from environment.   - she is advised to maintain close follow up with Eliodoro Guerin, DO for primary care needs.         I spent  20  minutes in the care of the patient today including review of labs from Thyroid  Function, CMP, and other relevant labs ; imaging/biopsy records (current and previous including abstractions from other facilities); face-to-face time discussing  her lab results and symptoms, medications doses, her options of short and long term treatment based on the latest standards of care / guidelines;   and documenting the encounter.  Shenell D Butch  participated in the discussions, expressed understanding, and voiced agreement with the above plans.  All questions were answered to her satisfaction. she is encouraged to contact clinic should she have any questions or concerns prior to her return visit.   Follow up plan: Return in about 6 months (around 01/30/2024) for Fasting Labs  in AM B4 8.   Kalvin Orf, MD [facility] [address] [place], [place] [address] Phone: [phone]  Fax: [phone]     07/30/2023, 9:15 AM  This note was partially dictated with voice recognition software. Similar sounding words can be transcribed inadequately or may not  be corrected upon review.

## 2023-08-06 ENCOUNTER — Other Ambulatory Visit: Payer: Self-pay | Admitting: Family Medicine

## 2023-08-06 DIAGNOSIS — E7800 Pure hypercholesterolemia, unspecified: Secondary | ICD-10-CM

## 2023-08-06 DIAGNOSIS — F411 Generalized anxiety disorder: Secondary | ICD-10-CM

## 2023-08-06 DIAGNOSIS — F320 Major depressive disorder, single episode, mild: Secondary | ICD-10-CM

## 2023-08-06 DIAGNOSIS — H0263 Xanthelasma of right eye, unspecified eyelid: Secondary | ICD-10-CM

## 2023-08-29 ENCOUNTER — Other Ambulatory Visit: Payer: Self-pay | Admitting: Family Medicine

## 2023-10-15 ENCOUNTER — Other Ambulatory Visit: Payer: Self-pay | Admitting: Medical Genetics

## 2023-11-20 ENCOUNTER — Other Ambulatory Visit (HOSPITAL_COMMUNITY)
Admission: RE | Admit: 2023-11-20 | Discharge: 2023-11-20 | Disposition: A | Discharge status: Home / Self Care | Payer: Self-pay | Source: Ambulatory Visit | Attending: Oncology | Admitting: Oncology

## 2023-11-30 LAB — GENECONNECT MOLECULAR SCREEN: Genetic Analysis Overall Interpretation: NEGATIVE

## 2023-12-17 ENCOUNTER — Other Ambulatory Visit: Payer: Self-pay | Admitting: Family Medicine

## 2023-12-17 DIAGNOSIS — H0263 Xanthelasma of right eye, unspecified eyelid: Secondary | ICD-10-CM

## 2023-12-17 DIAGNOSIS — E7800 Pure hypercholesterolemia, unspecified: Secondary | ICD-10-CM

## 2024-01-09 ENCOUNTER — Encounter: Payer: Self-pay | Admitting: Family Medicine

## 2024-01-09 ENCOUNTER — Ambulatory Visit (INDEPENDENT_AMBULATORY_CARE_PROVIDER_SITE_OTHER): Payer: 59 | Admitting: Family Medicine

## 2024-01-09 VITALS — BP 152/100 | HR 91 | Temp 97.7°F | Ht 65.0 in | Wt 159.4 lb

## 2024-01-09 DIAGNOSIS — R030 Elevated blood-pressure reading, without diagnosis of hypertension: Secondary | ICD-10-CM

## 2024-01-09 DIAGNOSIS — E7800 Pure hypercholesterolemia, unspecified: Secondary | ICD-10-CM | POA: Insufficient documentation

## 2024-01-09 DIAGNOSIS — G8929 Other chronic pain: Secondary | ICD-10-CM

## 2024-01-09 DIAGNOSIS — M5442 Lumbago with sciatica, left side: Secondary | ICD-10-CM

## 2024-01-09 DIAGNOSIS — K582 Mixed irritable bowel syndrome: Secondary | ICD-10-CM

## 2024-01-09 DIAGNOSIS — F320 Major depressive disorder, single episode, mild: Secondary | ICD-10-CM

## 2024-01-09 DIAGNOSIS — N939 Abnormal uterine and vaginal bleeding, unspecified: Secondary | ICD-10-CM

## 2024-01-09 DIAGNOSIS — Z0000 Encounter for general adult medical examination without abnormal findings: Secondary | ICD-10-CM

## 2024-01-09 DIAGNOSIS — E890 Postprocedural hypothyroidism: Secondary | ICD-10-CM

## 2024-01-09 DIAGNOSIS — F411 Generalized anxiety disorder: Secondary | ICD-10-CM

## 2024-01-09 MED ORDER — METHYLPREDNISOLONE ACETATE 80 MG/ML IJ SUSP
80.0000 mg | Freq: Once | INTRAMUSCULAR | Status: AC
  Administered 2024-01-09: 80 mg via INTRAMUSCULAR

## 2024-01-09 MED ORDER — ONDANSETRON 4 MG PO TBDP
4.0000 mg | ORAL_TABLET | Freq: Four times a day (QID) | ORAL | 0 refills | Status: AC | PRN
Start: 2024-01-09 — End: ?

## 2024-01-09 MED ORDER — ALBUTEROL SULFATE HFA 108 (90 BASE) MCG/ACT IN AERS: 2.0000 | INHALATION_SPRAY | Freq: Four times a day (QID) | RESPIRATORY_TRACT | 0 refills | Status: AC | PRN

## 2024-01-09 MED ORDER — ROSUVASTATIN CALCIUM 10 MG PO TABS: 10.0000 mg | ORAL_TABLET | Freq: Every day | ORAL | 4 refills | Status: AC

## 2024-01-09 MED ORDER — DICYCLOMINE HCL 10 MG PO CAPS: ORAL_CAPSULE | ORAL | 5 refills | Status: AC

## 2024-01-09 MED ORDER — DESVENLAFAXINE SUCCINATE ER 50 MG PO TB24: 50.0000 mg | ORAL_TABLET | Freq: Every day | ORAL | 4 refills | Status: AC

## 2024-01-09 NOTE — Telephone Encounter (Signed)
 FYI for PCP- documented in chart

## 2024-01-09 NOTE — Patient Instructions (Addendum)
 Please message me with some Blood pressure readings in about 2 weeks. Ok to use up to 3 over the counter ibuprofens every 8 hours as needed for pain.  Abnormal Uterine Bleeding Abnormal uterine bleeding means bleeding more than normal from your womb (uterus). It can include: Bleeding after sex. Bleeding between monthly (menstrual) periods. Bleeding that is heavier than normal. Monthly periods that last longer than normal. Bleeding after you have stopped having your monthly period (menopause). You should see a doctor for any kind of bleeding that is not normal. Treatment depends on the cause of your bleeding and how much you bleed. Follow these instructions at home: Medicines Take over-the-counter and prescription medicines only as told by your doctor. Ask your doctor about: Taking medicines such as aspirin and ibuprofen. Do not take these medicines unless your doctor tells you to take them. Taking over-the-counter medicines, vitamins, herbs, and supplements. You may be given iron pills. Take them as told by your doctor. Managing constipation If you take iron pills, you may need to take these actions to prevent or treat trouble pooping (constipation): Drink enough fluid to keep your pee (urine) pale yellow. Take over-the-counter or prescription medicines. Eat foods that are high in fiber. These include beans, whole grains, and fresh fruits and vegetables. Limit foods that are high in fat and sugar. These include fried or sweet foods. Activity Change your activity to decrease bleeding if you need to change your sanitary pad more than one time every 2 hours: Lie in bed with your feet raised (elevated). Place a cold pack on your lower belly. Rest as much as you are able until the bleeding stops or slows down. General instructions Do not use tampons, douche, or have sex until your doctor says these things are okay. Change your pads often. Get regular exams. These include: Pelvic  exams. Screenings for cancer of the cervix. It is up to you to get the results of any tests that are done. Ask how to get your results when they are ready. Watch for any changes in your bleeding. For 2 months, write down: When your monthly period starts. When your monthly period ends. When you get any abnormal bleeding from your vagina. What problems you notice. Keep all follow-up visits. Contact a doctor if: The bleeding lasts more than one week. You feel dizzy at times. You feel like you may vomit (nausea). You vomit. You feel light-headed or weak. Your symptoms get worse. Get help right away if: You faint. You have to change pads every hour. You have pain in your belly. You have a fever or chills. You get sweaty or weak. You pass large blood clots from your vagina. These symptoms may be an emergency. Get help right away. Call your local emergency services (911 in the [place] Do not wait to see if the symptoms will go away. Do not drive yourself to the hospital. Summary Abnormal uterine bleeding means bleeding more than normal from your womb (uterus). Any kind of bleeding that is not normal should be checked by a doctor. Treatment depends on the cause of your bleeding and how much you bleed. Get help right away if you faint, you have to change pads every hour, or you pass large blood clots from your vagina. This information is not intended to replace advice given to you by your health care provider. Make sure you discuss any questions you have with your health care provider. Document Revised: 06/27/2020 Document Reviewed: 06/27/2020 Elsevier Patient Education  2024  [employer].

## 2024-01-09 NOTE — Progress Notes (Signed)
 Brenda [name] is a 43 y.o. female presents to office today for annual physical exam examination.    She reports that she is having exacerbation of some low back pain that radiates into the left buttock.  She used to have this as a youth but attributed to having had a lumbar puncture done.  She notes that over the last couple of months symptoms have gotten slightly worse.  She is been utilizing heating pads, vibration therapy and Advil but only 200 mg with some improvement but no resolution.  She is able to ambulate independently and she reports no sensory changes.  She reports ongoing intermittent nausea that occurs when she gets IBS flareups.  She is predominantly constipated currently but occasionally does have diarrhea.  Has added FiberCon Gummies and eliminated MiraLAX  but continues with adequate water .  As needed Bentyl .  She is having some abnormal uterine bleeding where she tends to get a menstrual cycle roughly every 2 to 3 weeks in the last 4 to 7 days per cycle.  She has not reach out to her OB/GYN yet about this.  She does not report any dizziness.  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 occasional marijuana (once weekly)   Sexual activity: Yes    Birth control/protection: Surgical    Comment: tubal  Other Topics Concern   Not on file  Social History Narrative   Not on file   Social Drivers of Health   Financial Resource Strain: Low Risk  (01/05/2024)   Overall Financial Resource Strain (CARDIA)    Difficulty of Paying Living Expenses: Not very hard  Food Insecurity: No Food Insecurity (01/05/2024)   Hunger Vital Sign    Worried About Running Out of Food in the Last Year: Never true    Ran Out of Food in the Last Year: Never true  Transportation Needs: No Transportation Needs (01/05/2024)   PRAPARE - Administrator, Civil Service (Medical): No    Lack of Transportation (Non-Medical): No  Physical Activity: Insufficiently Active (01/05/2024)   Exercise Vital Sign    Days of Exercise per Week: 2 days    Minutes of Exercise per Session: 20 min  Stress: Stress Concern Present (01/05/2024)   [employer] of Occupational Health - Occupational Stress Questionnaire    Feeling of Stress: To some extent  Social Connections: Moderately Integrated (01/05/2024)   Social Connection and Isolation Panel  Frequency of Communication with Friends and Family: More than three times a week    Frequency of Social Gatherings with Friends and Family: More than three times a week    Attends Religious Services: 1 to 4 times per year    Active Member of [employer] or Organizations: No    Attends Engineer, Structural: Not on file    Marital Status: Married  Intimate Partner Violence: Unknown (10/27/2021)   Received from [facility]   HITS    Physically Hurt: Not on file    Insult or Talk Down To: Not on file    Threaten Physical Harm: Not on file    Scream or Curse: Not on file   Past Surgical History:  Procedure Laterality Date    APPENDECTOMY     THYROIDECTOMY Left 09/14/2019   Procedure: LEFT HEMITHYROIDECTOMY;  Surgeon: Karis Clunes, MD;  Location: [facility];  Service: ENT;  Laterality: Left;   Family History  Problem Relation Age of Onset   Anxiety disorder Mother    Asthma Mother    COPD Mother    Hypertension Mother    Hyperlipidemia Mother    Osteoporosis Mother    Diabetes Mother    Irritable bowel syndrome Mother    Heart disease Father    Heart attack Father    Bipolar disorder Sister    Schizophrenia Sister    Drug abuse Sister    Drug abuse Brother    Anxiety disorder Brother    Depression Brother    Suicidality Brother        cause of death 01-19-22   Thyroid  disease Brother    Asthma Son    Hyperlipidemia Maternal Grandmother    Hypertension Maternal Grandmother    Stroke Maternal Grandmother    Alzheimer's disease Maternal Grandmother    Cancer Maternal Grandfather        lung   Heart disease Maternal Grandfather    Kidney disease Paternal Grandmother    Cancer Paternal Grandfather    Colon cancer Neg Hx    Inflammatory bowel disease Neg Hx    Celiac disease Neg Hx     Current Outpatient Medications:    hydrOXYzine  (ATARAX ) 25 MG tablet, Take 0.5-1 tablets (12.5-25 mg total) by mouth at bedtime as needed for anxiety (or sleep)., Disp: 30 tablet, Rfl: 0   levothyroxine  (SYNTHROID ) 100 MCG tablet, TAKE 1 TABLET BY MOUTH EVERY MORNING BEFORE BREAKFAST, Disp: 90 tablet, Rfl: 1   albuterol  (VENTOLIN  HFA) 108 (90 Base) MCG/ACT inhaler, Inhale 2 puffs into the lungs every 6 (six) hours as needed for wheezing or shortness of breath., Disp: 8.5 g, Rfl: 0   desvenlafaxine  (PRISTIQ ) 50 MG 24 hr tablet, Take 1 tablet (50 mg total) by mouth daily., Disp: 90 tablet, Rfl: 4   dicyclomine  (BENTYL ) 10 MG capsule, Take 10mg  daily before meals and at bedtime as needed., Disp: 60 capsule, Rfl: 5   ondansetron  (ZOFRAN -ODT) 4 MG disintegrating tablet, Take 1 tablet (4 mg total) by mouth every 6  (six) hours as needed for nausea or vomiting., Disp: 20 tablet, Rfl: 0   rosuvastatin  (CRESTOR ) 10 MG tablet, Take 1 tablet (10 mg total) by mouth daily., Disp: 90 tablet, Rfl: 4  Allergies  Allergen Reactions   Milk-Related Compounds Nausea And Vomiting    Vomiting and sweating     ROS: Review of Systems Pertinent items noted in HPI and remainder of comprehensive ROS otherwise negative.    Physical exam BP ROLLEN)  152/100   Pulse 91   Temp 97.7 F (36.5 C)   Ht 5' 5 (1.651 m)   Wt 159 lb 6 oz (72.3 kg)   SpO2 99%   BMI 26.52 kg/m  General appearance: alert, cooperative, appears stated age, mild distress, and smells of marijuana Head: Normocephalic, without obvious abnormality, atraumatic Eyes: negative findings: lids and lashes normal, conjunctivae and sclerae normal, corneas clear, and pupils equal, round, reactive to light and accomodation Ears: normal TM's and external ear canals both ears Nose: Nares normal. Septum midline. Mucosa normal. No drainage or sinus tenderness. Throat: lips, mucosa, and tongue normal; teeth and gums normal Neck: no adenopathy, no carotid bruit, supple, symmetrical, trachea midline, and well-healed postsurgical scar noted along the anterior neck.  Thyroid  surgically absent Back: symmetric, no curvature. ROM normal. No CVA tenderness. Lungs: clear to auscultation bilaterally Heart: regular rate and rhythm, S1, S2 normal, no murmur, click, rub or gallop Abdomen: Minimal tenderness to palpation in the right lower quadrant but otherwise soft, nontender and nondistended with no palpable hepatosplenomegaly Extremities: extremities normal, atraumatic, no cyanosis or edema Pulses: 2+ and symmetric Skin: Skin color, texture, turgor normal. No rashes or lesions Lymph nodes: No anterior cervical or supraclavicular lymph node enlargement Neurologic: Alert and oriented X 3, normal strength and tone. Normal symmetric reflexes. Normal coordination and gait       01/09/2024   10:20 AM 01/07/2023   11:36 AM 10/07/2022    8:40 AM  Depression screen PHQ 2/9  Decreased Interest 1 0 0  Down, Depressed, Hopeless 1 0 1  PHQ - 2 Score 2 0 1  Altered sleeping 1 0 0  Tired, decreased energy 0 0 1  Change in appetite 0 0 1  Feeling bad or failure about yourself  1 0 1  Trouble concentrating 0 0 0  Moving slowly or fidgety/restless 0 0 0  Suicidal thoughts 0 0 0  PHQ-9 Score 4 0 4  Difficult doing work/chores Somewhat difficult Not difficult at all Not difficult at all      01/09/2024   10:20 AM 01/07/2023   11:36 AM 10/07/2022    8:41 AM 07/08/2022    1:09 PM  GAD 7 : Generalized Anxiety Score  Nervous, Anxious, on Edge 1 0 1 1  Control/stop worrying 1 0 1 0  Worry too much - different things 0 0 1 1  Trouble relaxing 0 0 1 0  Restless 1 0 0 0  Easily annoyed or irritable 0 0 0 0  Afraid - awful might happen 1 0 1 0  Total GAD 7 Score 4 0 5 2  Anxiety Difficulty Somewhat difficult Not difficult at all Not difficult at all Not difficult at all    Recent Results (from the past 2160 hours)  [facility] Molecular Screen - Blood ([facility])     Status: None   Collection Time: 11/20/23 11:47 AM  Result Value Ref Range   Genetic Analysis Overall Interpretation Negative    Genetic Disease Assessed      This is a screening test and does not detect all pathogenic or likely pathogenic variant(s) in the tested genes; diagnostic testing is recommended for individuals with a personal or family history of heart disease or hereditary cancer. [facility] Tier One  Population Screen is a screening test that analyzes 11 genes related to hereditary breast and ovarian cancer (HBOC) syndrome, Lynch syndrome, and familial hypercholesterolemia. This test only reports clinically significant pathogenic and likely  pathogenic  variants but does not report variants of uncertain significance (VUS). In addition, analysis of the PMS2 gene excludes exons 11-15, which  overlap with a known pseudogene (PMS2CL).    Genetic Analysis Report      No pathogenic or likely pathogenic variants were detected in the genes analyzed by this test.Genetic test results should be interpreted in the context of an individual's personal medical and family history. Alteration to medical management is NOT  recommended based solely on this result. Clinical correlation is advised.Additional Considerations- This is a screening test; individuals may still carry pathogenic or likely pathogenic variant(s) in the tested genes that are not detected by this test.-  For individuals at risk for these or other related conditions based on factors including personal or family history, diagnostic testing is recommended.- The absence of pathogenic or likely pathogenic variant(s) in the analyzed genes, while reassuring,  does not eliminate the possibility of a hereditary condition; there are other variants and genes associated with heart disease and hereditary cancer that are not included in this test.    Genes Tested See Notes     Comment: APOB, BRCA1, BRCA2, EPCAM, LDLR, LDLRAP1, PCSK9, PMS2, MLH1, MSH2, MSH6   Disclaimer See Notes     Comment: This test was developed and validated by [facility] This test has not been cleared or approved by the [place]  Food and Drug Administration (FDA). The [facility] laboratory is accredited by the College of American Pathologists (CAP) and certified under  the Clinical Laboratory Improvement Amendments (CLIA #: [id number]) to perform high-complexity clinical tests. This test is used for clinical purposes. It should not be regarded as investigational use only or for research use only.    Sequencing Location See Notes     Comment: Sequencing done at [facility] [address], [address], [place] [address] (CLIA# [id number])   Interpretation Methods and Limitations See Notes     Comment: Extracted DNA is enriched for targeted regions and then  sequenced using the [facility] Exome+ (R) assay on an Illumina DNA sequencing system. Data is then aligned to a modified version of GRCh38 and all genes are analyzed using the MANE transcript and MANE  Plus Clinical transcript, when available. Small variant calling is completed using a customized version of [employer]'s DNAseq software, augmented by a proprietary small variant caller for difficult variants. Copy number variants (CNVs) are then called  using a proprietary bioinformatics pipeline based on depth analysis with a comparison to similarly sequenced samples. Analysis of the PMS2 gene is limited to exons 1-10. The interpretation and reporting of variants in APOB, PCSK9, and LDLR is specific to  familial hypercholesterolemia; variants associated with hypobetalipoproteinemia are not included. Interpretation is based upon guidelines published by the [employer] of [employer] and Genomics [employer]), the Association for Mol ecular Pathology  (AMP) or their modification by [employer] when available and/or review of previous clinical assertions available in the [employer]. Interpretation is limited to the transcripts indicated on the report and +/- 10 bp into  intronic regions, except as noted below. [facility] variant classifications include pathogenic, likely pathogenic, variant of uncertain significance (VUS), likely benign, and benign. Only variants classified as pathogenic and likely pathogenic are included in  the report. All reported variants are confirmed through secondary manual inspection of DNA sequence data or orthogonal testing. Risk estimations and management guidelines included in this report are based on analysis of primary literature and  recommendations of applicable professional societies, and  should be regarded as approximations.Based on validation studies,this assay delivers > 99% sensitivity and specificity for single nucleotide variants and insertions  and  deletions (indels) up to 20  bp. Larger indels and complex variants are also reported but sensitivity may be reduced. Based on validation studies, this assay delivers > 99% sensitivity to multi-exon CNVs and > 90% sensitivity to single-exon CNVs. This test may not detect variants  in challenging regions (such as short tandem repeats, homopolymer runs, and segment duplications), sub-exonic CNVs, chromosomal aneuploidy, or variants in the presence of mosaicism. Phasing will be attempted and reported, when possible. Structural  rearrangements such as inversions, translocations, and gene conversions are not tested in this assay unless explicitly indicated. Additionally, deep intronic, promoter, and enhancer regions may not be covered. It is important to note that this is a  screening test and cannot detect all disease-causing variants. A negative result does not guarantee the absence of a rare, undetectable variant in the genes analyzed; consider using a diagnostic test if there is  significant personal and/or family history  of one of the conditions analyzed by this test. Any potential incidental findings outside of these genes and conditions will not be identified, nor reported. The results of a genetic test may be influenced by various factors, including bone marrow  transplantation, blood transfusions, or in rare cases, hematolymphoid neoplasms.Gene Specific Notes:APOB: analysis is limited to c.10580G>A and c.10579C>T; BRCA1: sequencing analysis extends to CDS +/-20 bp; BRCA2: sequencing analysis extends to CDS  +/-20 bp. EPCAM: analysis is limited to CNVof exons 8-9; LDLR: analysis includes CNV ofthe promoter; MLH1: analysis includes CNV of the promoter; PMS2: analysis is limited to exons 1-10.Brenda JINNY Kemp, Brenda [name], Brenda [name]      Assessment/ Plan: Brenda [name] here for annual physical exam.   Annual physical exam - Plan: CMP14+EGFR  Chronic left-sided low back pain with  left-sided sciatica - Plan: methylPREDNISolone acetate (DEPO-MEDROL) injection 80 mg  Elevated blood pressure reading in office without diagnosis of hypertension  Abnormal uterine bleeding (AUB)  Pure hypercholesterolemia - Plan: rosuvastatin  (CRESTOR ) 10 MG tablet, CMP14+EGFR, Lipid Panel  Postsurgical hypothyroidism - Plan: CMP14+EGFR, Lipid Panel, TSH + free T4, Thyroglobulin Level, Thyroglobulin antibody  GAD (generalized anxiety disorder) - Plan: desvenlafaxine  (PRISTIQ ) 50 MG 24 hr tablet, CMP14+EGFR  Depression, major, single episode, mild - Plan: desvenlafaxine  (PRISTIQ ) 50 MG 24 hr tablet, CMP14+EGFR, VITAMIN D  25 Hydroxy (Vit-D Deficiency, Fractures)  Irritable bowel syndrome with both constipation and diarrhea - Plan: dicyclomine  (BENTYL ) 10 MG capsule, CMP14+EGFR, VITAMIN D  25 Hydroxy (Vit-D Deficiency, Fractures), ondansetron  (ZOFRAN -ODT) 4 MG disintegrating tablet   Depo-Medrol 80 administered for low back pain.  Home stretches provided to the patient.  If symptoms persist, could consider further evaluation with chiropractor or orthopedics.  Blood pressure elevated and I suspect secondary to pain as above  Fasting labs collected today.  Medications have been renewed.  Will look for metabolic etiology of abnormal uterine bleeding but if this is normal thyroid  labs then I would favor having her see OB/GYN for further evaluation.  May need to consider pelvic ultrasound.  Physical exam was notable for mild right lower quadrant abdominal pain  Anxiety disorder and depression are stable and continue to respond well to Pristiq .  Rare use of Atarax  but still has it on hand if needed  IBS is stable.  Medications renewed.  Consider follow-up with gastroenterology if symptoms progress  Counseled on healthy lifestyle choices, including diet (rich in fruits, vegetables and lean meats and  low in salt and simple carbohydrates) and exercise (at least 30 minutes of moderate physical activity  daily).  Patient to follow up 1 year for CPE  Walfred Bettendorf M. Jolinda, DO

## 2024-01-12 ENCOUNTER — Ambulatory Visit: Payer: Self-pay | Admitting: Family Medicine

## 2024-01-12 ENCOUNTER — Other Ambulatory Visit: Payer: Self-pay | Admitting: Family Medicine

## 2024-01-12 DIAGNOSIS — N939 Abnormal uterine and vaginal bleeding, unspecified: Secondary | ICD-10-CM

## 2024-01-12 DIAGNOSIS — E559 Vitamin D deficiency, unspecified: Secondary | ICD-10-CM

## 2024-01-12 MED ORDER — VITAMIN D (ERGOCALCIFEROL) 1.25 MG (50000 UNIT) PO CAPS: 50000.0000 [IU] | ORAL_CAPSULE | ORAL | 3 refills | Status: AC

## 2024-01-14 ENCOUNTER — Ambulatory Visit: Payer: Self-pay | Admitting: Family Medicine

## 2024-01-14 ENCOUNTER — Ambulatory Visit (HOSPITAL_COMMUNITY)
Admission: RE | Admit: 2024-01-14 | Discharge: 2024-01-14 | Disposition: A | Discharge status: Home / Self Care | Payer: Self-pay | Source: Ambulatory Visit | Attending: Family Medicine | Admitting: Family Medicine

## 2024-01-14 DIAGNOSIS — N939 Abnormal uterine and vaginal bleeding, unspecified: Secondary | ICD-10-CM | POA: Insufficient documentation

## 2024-01-15 ENCOUNTER — Telehealth: Payer: Self-pay | Admitting: Family Medicine

## 2024-01-15 NOTE — Telephone Encounter (Signed)
Documented in result notes. 

## 2024-01-15 NOTE — Telephone Encounter (Signed)
 Copied from CRM [id number]. Topic: Clinical - Lab/Test Results >> Jan 15, 2024  8:41 AM Cherylann RAMAN wrote: Reason for CRM: Patient returning call regarding lab results. Patient can be reached at [phone].

## 2024-01-16 ENCOUNTER — Other Ambulatory Visit: Payer: Self-pay

## 2024-01-16 DIAGNOSIS — D259 Leiomyoma of uterus, unspecified: Secondary | ICD-10-CM

## 2024-01-16 NOTE — Telephone Encounter (Signed)
 Referral placed to patient preferred location, [facility]

## 2024-01-21 LAB — LIPID PANEL
Chol/HDL Ratio: 3.9 ratio (ref 0.0–4.4)
Cholesterol, Total: 162 mg/dL (ref 100–199)
HDL: 42 mg/dL (ref >39)
LDL Chol Calc (NIH): 96 mg/dL (ref 0–99)
Triglycerides: 132 mg/dL (ref 0–149)
VLDL Cholesterol Cal: 24 mg/dL (ref 5–40)

## 2024-01-21 LAB — CMP14+EGFR
ALT: 26 IU/L (ref 0–32)
AST: 27 IU/L (ref 0–40)
Albumin: 4.4 g/dL (ref 3.9–4.9)
Alkaline Phosphatase: 69 IU/L (ref 41–116)
BUN/Creatinine Ratio: 13 (ref 9–23)
BUN: 9 mg/dL (ref 6–24)
Bilirubin Total: 0.4 mg/dL (ref 0.0–1.2)
CO2: 20 mmol/L (ref 20–29)
Calcium: 9.4 mg/dL (ref 8.7–10.2)
Chloride: 102 mmol/L (ref 96–106)
Creatinine, Ser: 0.72 mg/dL (ref 0.57–1.00)
Globulin, Total: 3 g/dL (ref 1.5–4.5)
Glucose: 92 mg/dL (ref 70–99)
Potassium: 4.2 mmol/L (ref 3.5–5.2)
Sodium: 136 mmol/L (ref 134–144)
Total Protein: 7.4 g/dL (ref 6.0–8.5)
eGFR: 107 mL/min/1.73 (ref >59)

## 2024-01-21 LAB — THYROGLOBULIN ANTIBODY: Thyroglobulin Antibody: <1 [IU]/mL (ref 0.0–0.9)

## 2024-01-21 LAB — TSH+FREE T4
Free T4: 1.35 ng/dL (ref 0.82–1.77)
TSH: 4.91 u[IU]/mL — ABNORMAL HIGH (ref 0.450–4.500)

## 2024-01-21 LAB — THYROGLOBULIN LEVEL: Thyroglobulin (TG-RIA): <2 ng/mL

## 2024-01-21 LAB — VITAMIN D 25 HYDROXY (VIT D DEFICIENCY, FRACTURES): Vit D, 25-Hydroxy: 17.8 ng/mL — ABNORMAL LOW (ref 30.0–100.0)

## 2024-01-26 ENCOUNTER — Encounter: Payer: Self-pay | Admitting: Family Medicine

## 2024-02-02 ENCOUNTER — Ambulatory Visit (INDEPENDENT_AMBULATORY_CARE_PROVIDER_SITE_OTHER): Payer: Self-pay | Admitting: "Endocrinology

## 2024-02-02 ENCOUNTER — Encounter: Payer: Self-pay | Admitting: "Endocrinology

## 2024-02-02 VITALS — BP 114/74 | HR 84 | Ht 65.0 in | Wt 157.4 lb

## 2024-02-02 DIAGNOSIS — E890 Postprocedural hypothyroidism: Secondary | ICD-10-CM

## 2024-02-02 DIAGNOSIS — E559 Vitamin D deficiency, unspecified: Secondary | ICD-10-CM | POA: Insufficient documentation

## 2024-02-02 DIAGNOSIS — C73 Malignant neoplasm of thyroid gland: Secondary | ICD-10-CM

## 2024-02-02 DIAGNOSIS — E782 Mixed hyperlipidemia: Secondary | ICD-10-CM

## 2024-02-02 MED ORDER — LEVOTHYROXINE SODIUM 112 MCG PO TABS: ORAL_TABLET | ORAL | 1 refills | Status: AC

## 2024-02-02 NOTE — Progress Notes (Signed)
 02/02/2024, 11:33 AM      Endocrinology follow-up note   Subjective:    Patient ID: Brenda [name], female    DOB: [birth year], PCP Jolinda Norene HERO, DO   Past Medical History:  Diagnosis Date   Anxiety    Malignant neoplasm of thyroid  gland (HCC) 09/30/2019   Past Surgical History:  Procedure Laterality Date   APPENDECTOMY     THYROIDECTOMY Left 09/14/2019   Procedure: LEFT HEMITHYROIDECTOMY;  Surgeon: Karis Clunes, MD;  Location: [facility];  Service: ENT;  Laterality: Left;   Social History   Socioeconomic History   Marital status: Married    Spouse name: Chad   Number of children: 2   Years of education: some college   Highest education level: GED or equivalent  Occupational History    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 occasional marijuana (once weekly)   Sexual activity: Yes    Birth control/protection: Surgical    Comment: tubal  Other Topics Concern   Not on file  Social History Narrative   Not on file   Social Drivers of Health   Financial Resource Strain: Low Risk  (01/05/2024)   Overall Financial Resource Strain (CARDIA)    Difficulty of Paying Living Expenses: Not very hard  Food Insecurity: No Food Insecurity (01/05/2024)   Hunger Vital Sign    Worried About Running Out of Food in the Last Year: Never true    Ran Out of Food in the Last Year: Never true  Transportation Needs: No Transportation Needs (01/05/2024)   PRAPARE - Administrator, Civil Service (Medical): No    Lack of Transportation (Non-Medical): No  Physical Activity: Insufficiently Active (01/05/2024)   Exercise Vital Sign    Days of  Exercise per Week: 2 days    Minutes of Exercise per Session: 20 min  Stress: Stress Concern Present (01/05/2024)   [employer] of Occupational Health - Occupational Stress Questionnaire    Feeling of Stress: To some extent  Social Connections: Moderately Integrated (01/05/2024)   Social Connection and Isolation Panel    Frequency of Communication with Friends and Family: More than three times a week    Frequency of Social Gatherings with Friends and Family: More than three times a week    Attends Religious Services: 1 to 4 times per year    Active Member of [employer] or Organizations: No    Attends Engineer, Structural: Not on file    Marital Status: Married   Family History  Problem Relation Age of Onset   Anxiety disorder Mother    Asthma Mother    COPD Mother    Hypertension Mother    Hyperlipidemia Mother    Osteoporosis Mother    Diabetes Mother    Irritable bowel syndrome Mother    Heart disease Father    Heart attack  Father    Bipolar disorder Sister    Schizophrenia Sister    Drug abuse Sister    Drug abuse Brother    Anxiety disorder Brother    Depression Brother    Suicidality Brother        cause of death 02-20-22   Thyroid  disease Brother    Asthma Son    Hyperlipidemia Maternal Grandmother    Hypertension Maternal Grandmother    Stroke Maternal Grandmother    Alzheimer's disease Maternal Grandmother    Cancer Maternal Grandfather        lung   Heart disease Maternal Grandfather    Kidney disease Paternal Grandmother    Cancer Paternal Grandfather    Colon cancer Neg Hx    Inflammatory bowel disease Neg Hx    Celiac disease Neg Hx    Outpatient Encounter Medications as of 02/02/2024  Medication Sig   albuterol  (VENTOLIN  HFA) 108 (90 Base) MCG/ACT inhaler Inhale 2 puffs into the lungs every 6 (six) hours as needed for wheezing or shortness of breath.   desvenlafaxine  (PRISTIQ ) 50 MG 24 hr tablet Take 1 tablet (50 mg total) by mouth daily.    dicyclomine  (BENTYL ) 10 MG capsule Take 10mg  daily before meals and at bedtime as needed.   hydrOXYzine  (ATARAX ) 25 MG tablet Take 0.5-1 tablets (12.5-25 mg total) by mouth at bedtime as needed for anxiety (or sleep).   levothyroxine  (SYNTHROID ) 112 MCG tablet TAKE 1 TABLET BY MOUTH EVERY MORNING BEFORE BREAKFAST   ondansetron  (ZOFRAN -ODT) 4 MG disintegrating tablet Take 1 tablet (4 mg total) by mouth every 6 (six) hours as needed for nausea or vomiting.   rosuvastatin  (CRESTOR ) 10 MG tablet Take 1 tablet (10 mg total) by mouth daily.   Vitamin D , Ergocalciferol , (DRISDOL ) 1.25 MG (50000 UNIT) CAPS capsule Take 1 capsule (50,000 Units total) by mouth every 7 (seven) days.   [DISCONTINUED] levothyroxine  (SYNTHROID ) 100 MCG tablet TAKE 1 TABLET BY MOUTH EVERY MORNING BEFORE BREAKFAST   No facility-administered encounter medications on file as of 02/02/2024.   ALLERGIES: Allergies  Allergen Reactions   Milk-Related Compounds Nausea And Vomiting    Vomiting and sweating    VACCINATION STATUS: Immunization History  Administered Date(s) Administered   Tdap 10/07/2022    HPI Brenda [name] is 43 y.o. female who presents today to follow-up for postsurgical hypothyroidism and history of thyroid  malignancy.  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  She is status post Thyrogen  stimulated I-131 remnant ablation with post therapy whole-body scan.  See below.  -PMD:  Jolinda Norene HERO, DO.    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 A sample was sent for Afirma molecular study which was significant for  > 99% risk of malignancy reported on Aug 03, 2019. -She underwent left partial thyroidectomy on September 14, 2019 which showed 4 cm papillary thyroid  cancer on the left lobe, underwent completion thyroidectomy on September 21, 2019 with 0.9 cm focus of  malignancy on the right lobe. -She underwent I-131 thyroid  remnant ablation on November 19, 2019 with whole-body scan on November 29, 2019 revealing significant uptake in the neck, inconclusive for distant metastasis.  Her surveillance thyroid /neck ultrasound in April 2022 was  negative for any thyroid  remnant or residual nor lymphadenopathy.   Her subsequent thyrogen  stimulated whole-body scan is consistent with treatment effect, no evidence of recurrence or metastasis.  Her most recent previsit thyroid /neck ultrasound on December 18, 2022 was also unremarkable. Thyroglobulin level continues to be undetectable.    Patient has done very well following her surgery, required calcium  supplement briefly.  She remains on levothyroxine  100 mcg p.o. daily before breakfast.  Her previsit thyroid  function tests are consistent with under replacement.      She reports compliance and consistency to his medication.  She denies any family history of thyroid  malignancy, however some thyroid  dysfunction in a half brother. She continues to be marijuana smoker. She denies dysphagia, shortness of breath, voice change. She denies palpitations, tremors, nor heat/cold intolerance. She reports steady body weight.  Review of Systems She has steady weight.  Otherwise limited as above.  Objective:       02/02/2024    8:56 AM 01/09/2024    8:54 AM 01/09/2024    8:52 AM  Vitals with BMI  Height 5' 5  5' 5  Weight 157 lbs 6 oz  159 lbs 6 oz  BMI 26.19  26.52  Systolic 114 152 848  Diastolic 74 100 101  Pulse 84  91    BP 114/74   Pulse 84   Ht 5' 5 (1.651 m)   Wt 157 lb 6.4 oz (71.4 kg)   BMI 26.19 kg/m   Wt Readings from Last 3 Encounters:  02/02/24 157 lb 6.4 oz (71.4 kg)  01/09/24 159 lb 6 oz (72.3 kg)  07/30/23 153 lb 3.2 oz (69.5 kg)    Physical Exam   CMP ( most recent) CMP     Component Value Date/Time   NA 136 01/09/2024 0921   K 4.2 01/09/2024 0921   CL 102 01/09/2024 0921    CO2 20 01/09/2024 0921   GLUCOSE 92 01/09/2024 0921   GLUCOSE 100 (H) 06/22/2021 0836   BUN 9 01/09/2024 0921   CREATININE 0.72 01/09/2024 0921   CALCIUM  9.4 01/09/2024 0921   PROT 7.4 01/09/2024 0921   ALBUMIN 4.4 01/09/2024 0921   AST 27 01/09/2024 0921   ALT 26 01/09/2024 0921   ALKPHOS 69 01/09/2024 0921   BILITOT 0.4 01/09/2024 0921   GFRNONAA >60 06/22/2021 0836   GFRAA 122 07/02/2019 1058     Lipid Panel ( most recent) Lipid Panel     Component Value Date/Time   CHOL 162 01/09/2024 0921   TRIG 132 01/09/2024 0921   HDL 42 01/09/2024 0921   CHOLHDL 3.9 01/09/2024 0921   LDLCALC 96 01/09/2024 0921   LABVLDL 24 01/09/2024 0921      Lab Results  Component Value Date   TSH 4.910 (H) 01/09/2024   TSH 1.750 07/23/2023   TSH 0.667 01/07/2023   TSH 1.260 07/08/2022   TSH 4.570 (H) 01/02/2022   TSH 151.484 (H) 06/22/2021   TSH 1.390 01/03/2021   TSH 11.000 (H) 11/27/2020   TSH 3.290 10/09/2020   TSH 2.630 06/21/2020   FREET4 1.35 01/09/2024   FREET4 1.56 07/23/2023   FREET4 1.83 (H) 01/07/2023   FREET4 1.63 07/08/2022   FREET4 1.38 01/02/2022   FREET4 1.21 (H) 06/22/2021   FREET4 1.33 01/03/2021   FREET4 1.48 11/27/2020   FREET4 1.88 (H) 10/09/2020   FREET4 1.20 06/21/2020      Assessment & Plan:   1. Postsurgical hypothyroidism 2. Malignant neoplasm of thyroid  gland 3.  Vitamin D  deficiency 4.  Hyperlipidemia -I reviewed  her current and existing labs and imaging studies with her. - I have reviewed her available thyroid  records and clinically  evaluated the patient.  Reviewed her most recent thyroid  testing with whole-body scan as well as her labs. 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   Pathologic Stage Classification (pTNM, AJCC 8th Edition): pT2, pN  not  assigned (no nodes submitted or found)   -In light of her follicular variant PTC, stage 2 cancer, relative youth of the patient, she was offered  adjuvant therapy with I-131 thyroid  remnant ablation. This treatment was administered on November 19, 2019.  Post therapy whole-body scan results are consistent with significant uptake in the neck, inconclusive for distant metastases-nondiagnostic due to the degree of uptake of tracer at the neck, despite shielding.     -Her surveillance   thyroid /neck ultrasound in April 2022 was negative for any remnant or residual nor lymphadenopathy.     Her subsequent thyrogen  stimulated whole-body scan completed on June 25, 2021 is consistent with treatment effect, no evidence of distant metastasis or recurrence.   Her most recent surveillance thyroid /neck ultrasound is unremarkable from December 18, 2022.  Her previsit thyroglobulin levels continue to be undetectable  at  <1.0.    She will be considered for repeat Thyrogen  stimulated whole-body scan after her next visit.  Her previsit thyroid  function tests are consistent with under replacement.  I discussed and increased her levothyroxine  to 112 mcg p.o. daily before breakfast.     - We discussed about the correct intake of her thyroid  hormone, on empty stomach at fasting, with water , separated by at least 30 minutes from breakfast and other medications,  and separated by more than 4 hours from calcium , iron, multivitamins, acid reflux medications (PPIs). -Patient is made aware of the fact that thyroid  hormone replacement is needed for life, dose to be adjusted by periodic monitoring of thyroid  function tests.  Her last calcium  level without supplements was 9.7 mg/L.  She took herself off of the Os-Cal. Hyperlipidemia- she was initiated on Crestor  10 mg po qhs. Advised to continue.  The patient was counseled on the dangers of tobacco use, and was advised to quit.  Reviewed strategies to maximize success,  including removing cigarettes and smoking materials from environment. She has vitamin D  deficiency.  She is encouraged to continue to use vitamin D2 50,000 units weekly.  - she is advised to maintain close follow up with Jolinda Norene HERO, DO for primary care needs.         I spent  20  minutes in the care of the patient today including review of labs from Thyroid  Function, CMP, and other relevant labs ; imaging/biopsy records (current and previous including abstractions from other facilities); face-to-face time discussing  her lab results and symptoms, medications doses, her options of short and long term treatment based on the latest standards of care / guidelines;   and documenting the encounter.  Reta D Ehrman  participated in the discussions, expressed understanding, and voiced agreement with the above plans.  All questions were answered to her satisfaction. she is encouraged to contact clinic should she have any questions or concerns prior to her return visit.   Follow up plan: Return in about 6 months (around 08/01/2024) for F/U with Pre-visit Labs.   Ranny Earl, MD [facility] [address] [place], [place]  [address] Phone: [phone]  Fax: [phone]     02/02/2024, 11:33 AM  This note was partially dictated with voice recognition software. Similar sounding words can be transcribed inadequately or may not  be corrected upon review.

## 2024-03-26 ENCOUNTER — Other Ambulatory Visit: Payer: Self-pay | Admitting: "Endocrinology

## 2024-04-06 ENCOUNTER — Encounter: Payer: Self-pay | Admitting: Women's Health

## 2024-05-11 ENCOUNTER — Encounter: Payer: Self-pay | Admitting: Women's Health

## 2024-08-04 ENCOUNTER — Ambulatory Visit: Payer: Self-pay | Admitting: "Endocrinology

## 2025-01-12 ENCOUNTER — Encounter: Admitting: Family Medicine
